# Patient Record
Sex: Male | Born: 1985 | Hispanic: No
Health system: Midwestern US, Community
[De-identification: ages and names within clinical notes are randomized; demographics above are authoritative.]

## PROBLEM LIST (undated history)

## (undated) HISTORY — PX: BRAIN SURGERY: SHX531

---

## 2015-12-08 NOTE — Nursing Note (Signed)
Ambulatory Vitals, Ht, Wt - Text       Ambulatory Vitals Height Weight Entered On:  12/08/2015 14:03 EST    Performed On:  12/08/2015 14:03 EST by Randa Evens.,  BOBBY K               Vitals/Ht/Wt   Weight Estimated :   81.65 kg(Converted to: 180 lb 0 oz, 180.007 lb)    Height/Length Estimated :   172.72 cm(Converted to: 5 ft 8 in, 5.67 ft, 68.00 in)    BSA Estimated :   1.98 m2   Body Mass Index Estimated :   27.37 kg/m2   Randa Evens.,  BOBBY K - 12/08/2015 14:03 EST

## 2015-12-14 NOTE — Nursing Note (Signed)
Nursing Discharge Summary - Text       Physician Discharge Summary Entered On:  12/14/2015 20:59 EST    Performed On:  12/14/2015 20:58 EST by Debroah Baller               DC Information   Provider Instructions for Diet :   Regular diet   Provider Instructions for Activity :   Other: See printed post operative instructions.   Provider Instructions for Wound Care :   Other: See printed post operative instructions.   Liliana Cline NICOLE - 12/14/2015 20:58 EST

## 2015-12-15 NOTE — Discharge Summary (Signed)
 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 office                         MEDICATION LIST  Medication Reconciliation at Discharge:         New Medications  Other Medications  HYDROcodone-acetaminophen (Norco 325 mg-5 mg oral tablet) 1-2 tabs Oral (given by mouth) every 4 hours as needed for moderate pain for 30 Days. 1-2 tablets by mouth every 4 hours as needed for pain   not to exceed 4000 mg acetaminophen per day. Refills: 0.  Last Dose:____________________  Medications that have not changed  Other Medications  Non-Formulary Medication (testosterone booster)   Last Dose:____________________         Patient's Final Home Medication List Upon Discharge:          HYDROcodone-acetaminophen (Norco 325 mg-5 mg oral tablet) 1-2 tabs Oral (given by mouth) every 4 hours as needed for moderate pain for 30 Days. 1-2 tablets by mouth every 4 hours as needed for pain   not to exceed 4000 mg acetaminophen per day. Refills: 0.  Non-Formulary Medication (testosterone booster)          Comment:       ORDERS         Order Name Order Details   Discharge Patient 12/15/15 13:57:00 EST, Discharge Home/Self Care

## 2015-12-15 NOTE — Discharge Summary (Signed)
 Inpatient Patient Summary               [facility] - [facility]  [address]  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 The details of those changes are reflected below:         New Medications  Other Medications  HYDROcodone-acetaminophen (Norco 325 mg-5 mg oral tablet) 1-2 tabs Oral (given by mouth) every 4 hours as needed for moderate pain for 30 Days. 1-2 tablets by mouth every 4 hours as needed for pain   not to exceed 4000 mg acetaminophen per day. Refills: 0.  Last Dose:____________________  Medications that have not changed  Other Medications  Non-Formulary Medication (testosterone booster)   Last Dose:____________________      [facility] would like to thank you for allowing us  to assist you with your healthcare needs. The following includes patient education materials and information regarding your injury/illness.  Tesar, Dev has been  given the following list of follow-up instructions, prescriptions, and patient education materials:  Follow-up Instructions             With: Address: When:   WATROBSKI-PA, ERIN NICOLE     [phone]    Comments:   12/21/15 2:45pm [place] office                It is important to always keep an active list of medications available so that you can share with other providers and manage your medications appropriately. As an additional courtesy, we are also providing you with your final active medications list that you can keep with you.          HYDROcodone-acetaminophen (Norco 325 mg-5 mg oral tablet) 1-2 tabs Oral (given by mouth) every 4 hours as needed for moderate pain for 30 Days. 1-2 tablets by mouth every 4 hours as needed for pain   not to exceed 4000 mg acetaminophen per day. Refills: 0.  Non-Formulary Medication (testosterone booster)       Take only the medications listed above. Contact your doctor prior to taking any medications not on this list.  Discharge instructions, if any, will display below  Instructions for Diet: INSTRUCTIONS FOR DIET%>Regular diet   Instructions for Supplements: SUPPLEMENT INSTRUCTIONS%>   Instructions for Activity: INSTRUCTIONS FOR ACTIVITY%>Other: See printed post operative instructions.   Instructions for Wound Care: INSTRUCTIONS FOR WOUND CARE%>Other: See printed post operative instructions.  Medication leaflets, if any, will display below     Patient education materials, if any,  will display below                   KNEE ARTHROSCOPY-POSTOPERATIVE INSTRUCTIONS  Pain Medication:   Prescription medication as directed   No additional Tylenol with prescription pain medication   Over the counter anti-inflammatory medication such as Advil or Aleve in addition, if needed and tolerated  Icing:   Ice pack/bag to knee every 30 minutes every few hours while awake   Elevate leg above the heart to decrease swelling and pain  Crutches:   Use crutches to weight bear on surgical  leg as tolerated (average 5-7 days)  Dressing:   Reinforce dressing with sterile gauze for any drainage within first 48 hours   Change dressing on post operative day #5   You may shower and get the wounds wet on post operative day #5   Do not submerge knee in water or scrub incision sites   Cover wounds with dry band aids following dressing change or shower   Continue compression stocking as needed for swelling  Physical Therapy/Activity:   Will be scheduled at your follow up visit if indicated   Begin active motion of ankle (ankle pumps) and knee immediately following surgery and 3-4 times per day  Follow Up Appointment:   Call office for follow up appointment to be scheduled 1-2 weeks from date of surgery, if not already scheduled  Call office for temperature greater than 102 degrees Fahrenheit and for excessive swelling, pain, or redness around incision sites                                                                   IS IT A STROKE? Act FAST and Check for these signs:   FACE Does the face look uneven?   ARM Does one arm drift down?   SPEECH Does their speech sound strange?   TIME Call 9-1-1 at any sign of stroke  Heart Attack Signs  Chest discomfort: Most heart attacks involve discomfort in the center of the chest and lasts more than a few minutes, or goes away and comes back. It can feel like uncomfortable pressure, squeezing, fullness or pain.  Discomfort in upper body: Symptoms can include pain or discomfort in one or both arms, back, neck, jaw or stomach.  Shortness of breath: With or without discomfort.  Other signs: Breaking out in a cold sweat, nausea, or lightheaded.  Remember, MINUTES DO MATTER. If you experience any of these heart attack warning signs, call 9-1-1 to get immediate medical attention!       Yes - Patient/Family/Caregiver demonstrates understanding of instructions given  ______________________________ ___________ ___________________ ___________  Patient/Family/ Caregiver  Signature Date/Time Provider Signature Date/Time

## 2015-12-15 NOTE — Op Note (Signed)
Operative Report    Dia Crawford, MD  Service Date: 12/15/2015    PREOPERATIVE DIAGNOSES:    1.  Bucket handle tear of the medial meniscus of the right knee.  2.  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    POSTOPERATIVE DIAGNOSES:  1.  Displaced bucket handle tear posterior horn through the mid third  of right medial meniscus.  ICD-10 is S83.241A.  2.  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  ICD-10  is M23.52.  3.  Loose body of the right knee.  ICD-10 is M23.41.    PROCEDURE PERFORMED:  1.  Arthroscopic partial right medial meniscectomy.  CPT code 82956.  2.  Removal of loose body, right knee.  CPT is [phone].  3.  Debridement of torn anterior cruciate ligament of the right knee.   CPT [phone].    SURGEON:  Dr. Si Raider.    ASSISTANT:  Lazarus Salines, certified PA.    ANESTHESIA:  General by Dr. Zara Council.    TOURNIQUET TIME:  Right lower extremity approximately 30 minutes.    INDICATIONS:  Mr. Flinchum is a 30 year old male who has an active  athletic background resulting in a torn anterior cruciate ligament of  his right knee in approximately 2007.  He underwent anterior cruciate  reconstruction elsewhere and had smooth recovery and returned to  unrestricted supports.  He then suffered a twisting injury to his  right knee in 2011, with a resulting tear of the graft of his right  knee.  He had no specific treatment from that point and has had  intermittent instability, but has been cautious with running and  cutting activities.  He has moved from job to job and recently has  been working with [employer], theatre/television/film.  He states that he squatted to move a cabinet into place  in mid-January 2014 and suddenly had the onset right knee pain and  could not extend his knee.  He was subsequently evaluated and was  noted to have a loss of motion of the knee with the locked joint about  20 degrees shy of full extension.   His exam was compatible with a  displaced bucket handle medial meniscus tear.  This was confirmed with  imaging studies and with his ongoing symptoms, he is seen now for  arthroscopy.    FINDINGS AT SURGERY:  At arthroscopy, the patient had an essentially  an absent torn anterior cruciate ligament.  There was a large loose  body, the central aspect of the notch that predictably was from the  avulsion from his previous reconstruction when the injury occurred in  2011.  This resulted in a loose body and the complete tear of the  ligament.  These required debridement at this procedure.  There was a  displaced bucket handle fragment involving the marginal two-thirds of  the medial meniscus that was displaced to the notch.  The tear was  through the devascularized white-white portion of the meniscus,  therefore, it was not deemed repairable.  He was not to have anterior  cruciate reconstruction at this point by his election, so meniscal  resection was elected.  The lateral meniscus was normal.  The  posterior cruciate was intact.  Articular surfaces were clear in all  compartments.    The assistance of the PA expedited the case for instrument management  and wound closure.    DESCRIPTION OF  PROCEDURE:  The patient was brought as an outpatient to  operating room number 1, placed in a supine position on the operating  table.  After an adequate level of general anesthesia was achieved,  the right knee was examined.  He was noted to have a 1+ Lachman with a  fairly firm endpoint with a positive pivot glide.  He had a trace of  adduction laxity at 30 degrees but was stable near full extension.  He  had full passive extension with flexion to 125 degrees under  anesthesia.  After exam, a pneumatic tourniquet was positioned about  the right proximal thigh.  He was placed in the [facility] leg holder.   The extremity was scrubbed and prepped with ChloraPrep, draped to  provide a sterile field in the usual fashion.  Two grams of  Ancef was  given IV preoperatively per protocol.  The extremity was exsanguinated  using an Esmarch tourniquet inflated to 300 mmHg.    Two portals were utilized for the case with an inferolateral portal  fashioned first with a stab wound adjacent to the patellar tendon.  A  blunt trocar was used to introduce the arthroscopic sheath.  A 4 mm 30  degree scope was passed into the joint.  The joint was distended with  sterile lactated Ringer's solution with gravity inflow.  Good  visualization was achieved.  Photographs were taken of all aspects of  the joint during the case.  The patellofemoral articulation was  visualized first, found to be clear with central tracking.  Medial and  lateral gutters were inspected and were normal.  The knee was placed  under abduction stress, the scope advanced medially where the meniscal  tear was encountered.  It was noted to be torn through the white-white  zone with a peripheral rim of tissue intact.  At this point, the tear  was reduced, but was macerated and unrepairable.  Scissor forceps were  introduced.  It was released at its anterior horn attachment and the  shaver was then used to shave the bulk of the meniscus at its  periphery to its posterior horn attachment.  It was then released with  scissor forceps and the fragment removed.  The 3.5 shaver was  introduced, and the meniscal surface was transitioned to a smooth  surface.  This completed, the partial meniscectomy with a smooth rim  of peripheral tissue remaining.  Attention was then turned to the  notch where the loose body was encountered.  It was resected piecemeal  taking small fragments with a grasping forceps.  The loose body itself  was approximately 1 cm x 8 mm in size.  It appeared to be attached to  the stump of the original anterior cruciate ligament which was  completely torn.  This was a graft that had been placed in 2007 and  was torn in 2011.  The 4.0 shaver was used to complete the debridement  of the  anterior cruciate.  The notch was significantly stenotic.  The  posterior cruciate was intact.  The lateral compartment was then  visualized with a normal lateral meniscus and no articular defects.   The joint was again inspected, no other pathology encountered.  At  this point, the joint was copiously irrigated, suctioned dry,  infiltrated with 20 mL of 0.5% plain Marcaine solution.  Sterile  compressive dressing was applied, tourniquet released.  The patient  awakened and taken to recovery area in stable condition.  He tolerated  the  procedure well, there were no complications.  Final instrument,  needle and sponge counts were correct.    DISPOSITION:  He is to be discharged to home, was to work on active  range of motion, could weight-bear as tolerated, use crutches as  needed, was to be seen in followup in approximately a week to initiate  therapy and received a prescription for Norco for pain.      Harlene Salts, MD  TR: *n DD: 12/15/2015 15:17 TD: 12/15/2015 17:03 Job#: [id number]  \\[id number]\\DOC#: [id number]  \\[id number]\\  Signature Line    Electronically Signed on 12/21/2015 09:14 AM EST  ________________________________________________  Tama Gander

## 2015-12-15 NOTE — Procedures (Signed)
IntraOp Record - Brent Rollins             IntraOp Record - [facility] Summary                                                                   Primary Physician:        Tama Gander    Case Number:              [id number]    Finalized Date/Time:      12/15/15 14:00:38    Pt. Name:                 Brent Rollins, Brent Rollins    D.O.B./Sex:               Mar 12, 1986    Male    Med Rec #:                [id number]    Physician:                Tama Gander    Financial #:              [id number]    Pt. Type:                 S    Room/Bed:                 /    Admit/Disch:              12/15/15 11:30:00 -    Institution:       [facility] - Case Times                                                                                                         Entry 1                                                                                                          Patient      In Room Time             12/15/15 13:15:00               Out Room Time                   12/15/15 13:54:00    Anesthesia     Procedure  Start Time               12/15/15 13:28:00               Stop Time                       12/15/15 13:51:00    Last Modified By:         Linward Foster RNPricilla Riffle                              12/15/15 14:00:21      Brent [facility] - Case Times Audit                                                                          12/15/15 14:00:21         Owner: Danella Sensing                               Modifier: KOSTPA                                                        <+> 1         Out Room Time        <+> 1         Stop Time        [facility] - Safety Checklist - Sign In                                                                                         Entry 1                                                                                                          History/Physical on       Yes                             Procedure Consent               Yes    Chart  on Chart     Site Marked (if            Yes    applicable)     Last Modified By:         Linward Foster, RN, Pricilla Riffle                              12/15/15 13:34:25      [facility]rent [facility] - Case Attendance                                                                                                    Entry 1                         Entry 2                         Entry 3                                          Case Attendee             TSALAPATAS-MD,  Brent Rollins          CALDWELL-MD,  Harriette Ohara, RN, Pricilla Riffle    Role Performed            Anesthesiologist                Surgeon Primary                 Circulator    Time In                   12/15/15 13:15:00               12/15/15 13:15:00               12/15/15 13:15:00    Time Out                  12/15/15 13:54:00               12/15/15 13:54:00               12/15/15 13:54:00    Procedure                 Knee Arthroscopy                Knee Arthroscopy                Knee Arthroscopy                              Operative(Right)                Operative(Right)  Operative(Right)    Last Modified By:         Linward Foster, RN, Lanell Persons, RN, Lanell Persons, RNPricilla Riffle                              12/15/15 14:00:34               12/15/15 14:00:34               12/15/15 14:00:34                                Entry 4                         Entry 5                                                                          Case Attendee             METTS,  Mora Bellman                                                              NICOLE    Role Performed            Surgical Scrub                  Other Authorized                                                              Personnel    Time In                   12/15/15 13:15:00               12/15/15 13:15:00    Time Out                  12/15/15 13:54:00               12/15/15 13:54:00    Procedure                 Knee Arthroscopy                Knee Arthroscopy                               Operative(Right)  Operative(Right)    Last Modified By:         Linward Foster, RN, Lanell Persons, RNPricilla Riffle                              12/15/15 14:00:34               12/15/15 14:00:34      [facility] - Case Attendance Audit                                                                     12/15/15 14:00:34         Owner: Danella Sensing                               Modifier: KOSTPA                                                            1     <+> Time Out            1     <*> Procedure                              Knee Arthroscopy Operative(Right)            2     <+> Time Out            2     <*> Procedure                              Knee Arthroscopy Operative(Right)            3     <+> Time Out            3     <*> Procedure                              Knee Arthroscopy Operative(Right)            4     <+> Time Out            4     <*> Procedure                              Knee Arthroscopy Operative(Right)            5     <+> Time In            5     <+> Time Out            5     <*> Procedure  Knee Arthroscopy Operative(Right)     12/15/15 13:33:21         Owner: ZOXWRU                               Modifier: KOSTPA                                                        <+> 5         Case Attendee        <+> 5         Role Performed        <+> 5         Procedure     12/15/15 13:31:44         Owner: [email or web address]                               Modifier: KOSTPA                                                        <+> 1         Time In        <+> 1         Procedure        <+> 2         Time In        <+> 2         Procedure            3     <+> Time In            3     <*> Procedure                              Knee Arthroscopy Operative(Right)            4     <+> Time In            4     <*> Procedure                              Knee Arthroscopy Operative(Right)     12/15/15 13:30:18         Owner: [email or web address]                               Modifier: KOSTPA                                                         <+> 3         Case Attendee        <+> 3         Role Performed        <+>  3         Procedure        <+> 4         Case Attendee        <+> 4         Role Performed        <+> 4         Procedure        [facility] - Skin Assessment                                                                                                    Entry 1                                                                                                          Skin Integrity            Intact    Last Modified By:         Linward Foster RNPricilla Riffle                              12/15/15 13:34:30      Brent Rollins - Patient Positioning                                                                                                Entry 1                                                                                                          Procedure                 Knee Arthroscopy                Body Position  Lithotomy                              Operative(Right)    Left Arm Position         Extended on Padded Arm          Right Arm Position              Extended on Padded Arm                              Board w/Security Strap                                          Board w/Security Strap    Left Leg Position         Stirrup Leg Support             Right Leg Position              Knee Distraction Device                              Secured In                                                      Used    Feet Uncrossed            Yes                             Pressure Points                 Yes                                                              Checked     Additional                [employer], Safety Strap    Information     Positioned By             Brent Rollins,  Brent Rollins,         Outcome Met (O.80)              Yes                              WATROBSKI-PA,  Marcy Salvo, RN,  Pricilla Riffle    Last Modified By:         Linward Foster, RN, Pricilla Riffle                              12/15/15 13:34:19      [facility] - Skin Prep                                                                                                          Entry 1                                                                                                          Hair Removal     Skin Prep      Prep Agents (Im.270)     Chlorhexidine Gluconate         Prep Area (Im.270)              Leg                              2% w/Alcohol     Prep Area Details        Right                           Prep By                         Tama Gander    Outcome Met (O.100)       Yes    Last Modified By:         Juanito Doom                              12/15/15 13:34:54      [facility] - Counts Initial and Final                                                                                           Entry 1  Initial Counts      Initial Counts           Linward Foster, RN, Pricilla Riffle,         Items included in               Sponges, Sharps     Performed By             Boyd Kerbs              the Initial Count     Final Counts      Final Counts             Linward Foster, RN, Pricilla Riffle,         Final Count Status              Correct     Performed By             Marcy Salvo J     Items Included in        Sponges, Sharps     Final Count     Outcome Met (O.20)        Yes    Last Modified By:         Linward Foster, RN, Pricilla Riffle                              12/15/15 13:32:22      Brent Rollins - General Case Data                                                                                                  Entry 1                                                                                                          Case Information      ASA Class                2                               Case Level                       Level 3     OR                       JI 01  Specialty                       Orthopedic (SN)     Wound Class              1-Clean    Preop Diagnosis           M23.203    Last Modified By:         Linward Foster RNPricilla Riffle                              12/15/15 13:31:39      [facility]rent Roll[facility] - Fire Risk Assessment                                                                                               Entry 1                                                                                                          Fire Risk                 Alcohol Based Prep              Fire Risk Score                 1    Assessment: If            Solution    checked, checkmark     = 1 point     Last Modified By:         Linward Foster RN, Pricilla Riffle                              12/15/15 13:32:38      Brent Rollins - Safety Checklist - Time Out                                                                                        Entry 1  Surgical/Procedure        Yes                             Time Out Complete               12/15/15 13:27:00    Team confirms     correct patient,     correct site and     correct procedure     Last Modified By:         Linward Foster RNPricilla Riffle                              12/15/15 13:29:48      [facility] - Patient Care Devices                                                                                               Entry 1                         Entry 2                                                                          Equipment Type            MACHINE SEQUENTIAL              BAIR                              COMPRESSION    SCD Sleeve Site           Leg Left    Equipment/Tag Number      [id number]                          [id number]    Initiated Pre             Yes    Induction     Last Modified By:         Linward Foster, RN, Lanell Persons, RNPricilla Riffle                              12/15/15  13:33:56               12/15/15 13:33:56      [facility] - Tourniquet  Entry 1                                                                                                          Tourniquet Type           TOURNIQUET PNEUMATIC            Serial Number                   [id number]    Setting                   300 mmHg                        Placement                       Leg Upper Right    Padding (Im.120)          Yes    Tourniquet Times      Inflated                 12/15/15 13:27:00               Deflated                        12/15/15 13:49:00    Applied By                Tama Gander         Outcome Met (O.60)              Yes    Last Modified By:         Juanito Doom                              12/15/15 13:55:32      [facility] - Tourniquet Audit                                                                          12/15/15 13:55:32         Owner: Danella Sensing                               Modifier: KOSTPA                                                            1     <*> Tourniquet  Type                        TOURNIQUET PNEUMATIC            1     <+> Deflated        [facility] - Medications                                                                                                        Entry 1                                                                                                          Medication                BUPIVACAINE HCL 0.5%            Route of Admin                  Local Injection                              INJECTION 0.5%    Dose/Volume               20 ML                           Site                            Knee    (include amount and     unit of measure)     Site Detail               Right                           Administered By                 Tama Gander    Outcome Met (O.130)       Yes    Last Modified By:         Linward Foster, RN, Pricilla Riffle                               12/15/15 13:50:04      [facility]rent [facility] - Medications Audit  12/15/15 13:50:04         Owner: ZOXWRU                               Modifier: KOSTPA                                                            1     <*> Medication                             BUPIVACAINE HCL 0.5% INJECTION 0.5%            1     <*> Dose/Volume (include amount and        30 ML                      unit of measure)        [facility] - Dressing/Packing                                                                                                   Entry 1                                                                                                          Site                      Knee                            Site Details                    Right    Dressing Item     Details      Dressing Item            Medicated Gauze, 4x4's,         Cast/Splint (Im.290)            Cast Padding     (Im.290)                 ABD, Elastic wrap    Last Modified By:         Linward Foster, RN, Pricilla Riffle  12/15/15 13:32:31      [facility] - Procedures                                                                                                         Entry 1                                                                                                          Procedure     Description      Procedure                Knee Arthroscopy                Modifiers                       Right                              Operative     Surgical Procedure       KNEE ARTHROSCOPY,     Text                     PARTIAL MEDIAL                              MENISCECTOMY, REMOVAL                              OF LO9OSE BODIES,                              DEBRIDEMENTY OF ACL                              RIGHT    Primary Procedure         Yes                             Primary Surgeon                 Tama Gander    Start                     12/15/15 13:28:00                Stop  12/15/15 13:51:00    Anesthesia Type           General                         Surgical Service                Orthopedic (SN)    Wound Class               1-Clean    Last Modified By:         Linward Foster RNPricilla Riffle                              12/15/15 14:00:28      [facility] - Procedures Audit                                                                          12/15/15 14:00:28         Owner: Danella Sensing                               Modifier: KOSTPA                                                        <+> 1         Stop     12/15/15 13:46:04         Owner: Danella Sensing                               Modifier: KOSTPA                                                            1     <*> Procedure                              Knee Arthroscopy Operative            1     <*> Surgical Procedure Text                KNEE ARTHROSCOPY, MENISCECTOMY RIGHT        [facility] - Safety Checklist - Sign Out  Entry 1                                                                                                          Patient Safety            Yes    Communication Guide     Used Throughout Case     Last Modified By:         Linward Foster RN, Pricilla Riffle                              12/15/15 13:34:33      [facility] - Transfer                                                                                                           Entry 1                                                                                                          Transferred By            Silvestre Moment,         Via                             Vinnie Langton, RN, Pricilla Riffle    Post-op Destination       PACU    Skin Assessment      Condition                Intact    Last Modified By:         Juanito Doom                              12/15/15 13:45:01

## 2021-03-21 ENCOUNTER — Emergency Department (HOSPITAL_COMMUNITY): Payer: Medicaid Other

## 2021-03-21 ENCOUNTER — Other Ambulatory Visit: Payer: Self-pay

## 2021-03-21 ENCOUNTER — Emergency Department (HOSPITAL_COMMUNITY)
Admission: EM | Admit: 2021-03-21 | Discharge: 2021-03-22 | Disposition: A | Discharge status: Home / Self Care | Payer: Medicaid Other | Attending: Emergency Medicine | Admitting: Emergency Medicine

## 2021-03-21 DIAGNOSIS — R202 Paresthesia of skin: Secondary | ICD-10-CM | POA: Insufficient documentation

## 2021-03-21 DIAGNOSIS — W208XXA Other cause of strike by thrown, projected or falling object, initial encounter: Secondary | ICD-10-CM | POA: Insufficient documentation

## 2021-03-21 DIAGNOSIS — Y990 Civilian activity done for income or pay: Secondary | ICD-10-CM | POA: Diagnosis not present

## 2021-03-21 DIAGNOSIS — M542 Cervicalgia: Secondary | ICD-10-CM | POA: Insufficient documentation

## 2021-03-21 DIAGNOSIS — R000 Tachycardia, unspecified: Secondary | ICD-10-CM | POA: Insufficient documentation

## 2021-03-21 DIAGNOSIS — S0990XA Unspecified injury of head, initial encounter: Secondary | ICD-10-CM

## 2021-03-21 DIAGNOSIS — S060X0A Concussion without loss of consciousness, initial encounter: Secondary | ICD-10-CM | POA: Insufficient documentation

## 2021-03-21 DIAGNOSIS — S0083XA Contusion of other part of head, initial encounter: Secondary | ICD-10-CM | POA: Diagnosis not present

## 2021-03-21 MED ORDER — ACETAMINOPHEN 500 MG PO TABS: 500.0000 mg | ORAL_TABLET | Freq: Four times a day (QID) | ORAL | 0 refills | Status: AC | PRN

## 2021-03-21 MED ORDER — HYDROCODONE-ACETAMINOPHEN 5-325 MG PO TABS
1.0000 | ORAL_TABLET | Freq: Once | ORAL | Status: AC
  Administered 2021-03-21: 1 via ORAL
  Filled 2021-03-21: qty 1

## 2021-03-21 MED ORDER — ONDANSETRON HCL 4 MG/2ML IJ SOLN: 4.0000 mg | Freq: Once | INTRAMUSCULAR | Status: DC

## 2021-03-21 MED ORDER — IBUPROFEN 800 MG PO TABS: 800.0000 mg | ORAL_TABLET | Freq: Three times a day (TID) | ORAL | 0 refills | Status: AC

## 2021-03-21 MED ORDER — ONDANSETRON 4 MG PO TBDP
4.0000 mg | ORAL_TABLET | Freq: Once | ORAL | Status: AC
  Administered 2021-03-21: 4 mg via ORAL
  Filled 2021-03-21: qty 1

## 2021-03-21 MED ORDER — TETANUS-DIPHTH-ACELL PERTUSSIS 5-2.5-18.5 LF-MCG/0.5 IM SUSY: 0.5000 mL | PREFILLED_SYRINGE | Freq: Once | INTRAMUSCULAR | Status: DC

## 2021-03-21 NOTE — Discharge Instructions (Signed)
Please schedule an appointment with a neurologist if you continue to have numbness and/or tingling or your arms or legs.

## 2021-03-21 NOTE — ED Notes (Signed)
Returned from MRI at this time, placed on monitor

## 2021-03-21 NOTE — ED Notes (Signed)
Patient refused IV at this time. Does not want blood work. ED MD made aware

## 2021-03-21 NOTE — ED Provider Notes (Signed)
Emergency Medicine Provider Triage Evaluation Note  Edward Bennett , a 35 y.o. male  was evaluated in triage.  Pt complains of head injury. States he was standing under a tree when a large tree limb fell onto his head from 40 feet. Denies loc. He did fall to the ground and is c/o neck pain, head pain, facial pain.  Review of Systems  Positive: Head pain, neck pain, facial pain Negative: loc  Physical Exam  BP (!) 142/100 (BP Location: Right Arm)   Pulse 86   Temp 98.6 F (37 C) (Oral)   Resp 18   SpO2 99%  Gen:   Awake, no distress   Resp:  Normal effort  MSK:   Moves extremities without difficulty  Other:  Mild ttp to the cervical spine, facial abrasion noted to the forehead  Medical Decision Making  Medically screening exam initiated at 3:55 PM.  Appropriate orders placed.  Najib Colmenares was informed that the remainder of the evaluation will be completed by another provider, this initial triage assessment does not replace that evaluation, and the importance of remaining in the ED until their evaluation is complete.     Karrie Meres, PA-C 03/21/21 1600    Tegeler, Canary Brim, MD 03/21/21 [phone]

## 2021-03-21 NOTE — ED Notes (Signed)
Pt ambulatory to [facility]

## 2021-03-21 NOTE — ED Provider Notes (Signed)
[facility]   CSN: [id number] Arrival date & time: 03/21/21  1545     History Chief Complaint  Patient presents with  . Head Injury    Edward Bennett is a 35 y.o. male.  The history is provided by the patient.  Head Injury Location:  Generalized Time since incident:  3 hours Mechanism of injury: direct blow   Mechanism of injury comment:  Tree limb fell onto his head from 40+ feet in the air Pain details:    Quality:  Aching   Radiates to:  Face   Severity:  Moderate   Duration:  3 hours   Timing:  Constant   Progression:  Unchanged Chronicity:  New Relieved by:  None tried Worsened by:  Movement Ineffective treatments:  None tried Associated symptoms: headache, nausea, neck pain and numbness   Associated symptoms: no blurred vision, no difficulty breathing, no disorientation, no double vision, no focal weakness, no hearing loss, no loss of consciousness, no memory loss, no seizures, no tinnitus and no vomiting   Neck Injury This is a new problem. The current episode started 3 to 5 hours ago. The problem occurs constantly. The problem has not changed since onset.Associated symptoms include headaches. Pertinent negatives include no chest pain, no abdominal pain and no shortness of breath. The symptoms are aggravated by twisting. Nothing relieves the symptoms. He has tried nothing for the symptoms.   Paresthesias to the R upper extremity with subjective numbness     History reviewed. No pertinent past medical history.  There are no problems to display for this patient.   History reviewed. No pertinent surgical history.     History reviewed. No pertinent family history.     Home Medications Prior to Admission medications   Medication Sig Start Date End Date Taking? Authorizing Provider  acetaminophen (TYLENOL) 500 MG tablet Take 1 tablet (500 mg total) by mouth every 6 (six) hours as needed for up to 5 days  for moderate pain. 03/21/21 03/26/21 Yes Ardeen FillersBuchanan, Myer Bohlman, DO  ibuprofen (ADVIL) 800 MG tablet Take 1 tablet (800 mg total) by mouth 3 (three) times daily for 3 days. 03/21/21 03/24/21 Yes Ardeen FillersBuchanan, Lenton Gendreau, DO    Allergies    Morphine and related and Tramadol  Review of Systems   Review of Systems  Constitutional: Negative for chills and fever.  HENT: Negative for ear pain, hearing loss, sore throat and tinnitus.   Eyes: Negative for blurred vision, double vision, pain and visual disturbance.  Respiratory: Negative for cough and shortness of breath.   Cardiovascular: Negative for chest pain and palpitations.  Gastrointestinal: Positive for nausea. Negative for abdominal pain and vomiting.  Genitourinary: Negative for dysuria and hematuria.  Musculoskeletal: Positive for neck pain. Negative for arthralgias and back pain.  Skin: Negative for color change and rash.  Neurological: Positive for numbness and headaches. Negative for focal weakness, seizures, loss of consciousness, syncope and weakness.  Psychiatric/Behavioral: Negative for memory loss.  All other systems reviewed and are negative.   Physical Exam Updated Vital Signs BP 138/85   Pulse 63   Temp 98.1 F (36.7 C) (Oral)   Resp 15   Ht 5\' 7"  (1.702 m)   Wt 81.6 kg   SpO2 96%   BMI 28.19 kg/m   Physical Exam Vitals and nursing note reviewed.  Constitutional:      Appearance: He is well-developed.  HENT:     Head: Normocephalic.     Jaw:  There is normal jaw occlusion.     Comments:  Abrasions to the forehead and upper scalp. Hemostatic. Mild hematoma without crepitus. Eyes:     General: No visual field deficit.    Conjunctiva/sclera: Conjunctivae normal.     Pupils: Pupils are equal.     Right eye: Pupil is round, reactive and not sluggish.     Left eye: Pupil is round, reactive and not sluggish.     Visual Fields: Right eye visual fields normal and left eye visual fields normal.  Neck:     Trachea: Trachea  normal.     Comments:  C-collar in place Cardiovascular:     Rate and Rhythm: Regular rhythm. Tachycardia present.     Pulses: Normal pulses.     Heart sounds: No murmur heard.   Pulmonary:     Effort: Pulmonary effort is normal. No respiratory distress.     Breath sounds: Normal breath sounds.  Abdominal:     Palpations: Abdomen is soft.     Tenderness: There is no abdominal tenderness.  Musculoskeletal:     Cervical back: Spinous process tenderness and muscular tenderness present.  Skin:    General: Skin is warm and dry.  Neurological:     General: No focal deficit present.     Mental Status: He is alert and oriented to person, place, and time.     GCS: GCS eye subscore is 4. GCS verbal subscore is 5. GCS motor subscore is 6.     Cranial Nerves: Cranial nerves are intact. No cranial nerve deficit, dysarthria or facial asymmetry.     Sensory: Sensory deficit present.     Motor: Motor function is intact.     Coordination: Coordination is intact.     Comments:  Subjective decreased sensation of LUE compared to right     ED Results / Procedures / Treatments   Labs (all labs ordered are listed, but only abnormal results are displayed) Labs Reviewed  COMPREHENSIVE METABOLIC PANEL  CBC WITH DIFFERENTIAL/PLATELET    EKG None  Radiology CT Head Wo Contrast  Result Date: 03/21/2021 CLINICAL DATA:  Recent trauma with headaches and neck pain, initial encounter EXAM: CT HEAD WITHOUT CONTRAST CT MAXILLOFACIAL WITHOUT CONTRAST CT CERVICAL SPINE WITHOUT CONTRAST TECHNIQUE: Multidetector CT imaging of the head, cervical spine, and maxillofacial structures were performed using the standard protocol without intravenous contrast. Multiplanar CT image reconstructions of the cervical spine and maxillofacial structures were also generated. COMPARISON:  None. FINDINGS: CT HEAD FINDINGS Brain: No evidence of acute infarction, hemorrhage, hydrocephalus, extra-axial collection or mass  lesion/mass effect. Vascular: No hyperdense vessel or unexpected calcification. Skull: Changes of prior right craniotomy are noted in the left posterior parietal region. Other: None. CT MAXILLOFACIAL FINDINGS Osseous: Irregularity of the nasal bones is seen without significant soft tissue abnormality. These likely represent chronic nasal bone fractures. No acute fracture is identified. Orbits: Orbits and their contents are within normal limits. Sinuses: Paranasal sinuses are unremarkable. Soft tissues: No significant soft tissue abnormality is noted. CT CERVICAL SPINE FINDINGS Alignment: Within normal limits. Skull base and vertebrae: 7 cervical segments are well visualized. Vertebral body height is well maintained. No acute fracture or acute facet abnormality is noted. Noted. Soft tissues and spinal canal: Surrounding soft tissue structures are within normal limits. Upper chest: Visualized lung apices are unremarkable. Other: None IMPRESSION: CT of the head: Postsurgical changes in the posterior left parietal region. No acute intracranial abnormality noted. CT of the maxillofacial bones: Mild irregularity of the  nasal bones is seen which appears chronic in nature without evidence of soft tissue abnormality. No other fracture is seen. CT of the cervical spine: No acute abnormality Electronically Signed   By: Alcide Clever M.D.   On: 03/21/2021 16:51   CT Cervical Spine Wo Contrast  Result Date: 03/21/2021 CLINICAL DATA:  Recent trauma with headaches and neck pain, initial encounter EXAM: CT HEAD WITHOUT CONTRAST CT MAXILLOFACIAL WITHOUT CONTRAST CT CERVICAL SPINE WITHOUT CONTRAST TECHNIQUE: Multidetector CT imaging of the head, cervical spine, and maxillofacial structures were performed using the standard protocol without intravenous contrast. Multiplanar CT image reconstructions of the cervical spine and maxillofacial structures were also generated. COMPARISON:  None. FINDINGS: CT HEAD FINDINGS Brain: No  evidence of acute infarction, hemorrhage, hydrocephalus, extra-axial collection or mass lesion/mass effect. Vascular: No hyperdense vessel or unexpected calcification. Skull: Changes of prior right craniotomy are noted in the left posterior parietal region. Other: None. CT MAXILLOFACIAL FINDINGS Osseous: Irregularity of the nasal bones is seen without significant soft tissue abnormality. These likely represent chronic nasal bone fractures. No acute fracture is identified. Orbits: Orbits and their contents are within normal limits. Sinuses: Paranasal sinuses are unremarkable. Soft tissues: No significant soft tissue abnormality is noted. CT CERVICAL SPINE FINDINGS Alignment: Within normal limits. Skull base and vertebrae: 7 cervical segments are well visualized. Vertebral body height is well maintained. No acute fracture or acute facet abnormality is noted. Noted. Soft tissues and spinal canal: Surrounding soft tissue structures are within normal limits. Upper chest: Visualized lung apices are unremarkable. Other: None IMPRESSION: CT of the head: Postsurgical changes in the posterior left parietal region. No acute intracranial abnormality noted. CT of the maxillofacial bones: Mild irregularity of the nasal bones is seen which appears chronic in nature without evidence of soft tissue abnormality. No other fracture is seen. CT of the cervical spine: No acute abnormality Electronically Signed   By: Alcide Clever M.D.   On: 03/21/2021 16:51   MR BRAIN WO CONTRAST  Result Date: 03/21/2021 CLINICAL DATA:  Initial evaluation for acute head trauma. EXAM: MRI HEAD WITHOUT CONTRAST TECHNIQUE: Multiplanar, multiecho pulse sequences of the brain and surrounding structures were obtained without intravenous contrast. COMPARISON:  Prior head CT from earlier the same day. FINDINGS: Brain: Cerebral volume within normal limits for age. No focal parenchymal signal abnormality. No abnormal foci of restricted diffusion to suggest  acute or subacute ischemia. Gray-white matter differentiation maintained. No encephalomalacia to suggest chronic cortical infarction. Susceptibility artifact at the left parietal convexity related to overlying craniotomy. No other evidence for acute or chronic intracranial hemorrhage. No mass lesion, midline shift or mass effect. No hydrocephalus or extra-axial fluid collection. Vascular: Major intracranial vascular flow voids are maintained. Skull and upper cervical spine: Craniocervical junction within normal limits. Bone marrow signal intensity normal. Prior left parietal craniotomy. Soft tissue contusion present at the central forehead. No visible calvarial fracture on prior CT. Sinuses/Orbits: Globes and orbital soft tissues within normal limits. Mild scattered mucosal thickening noted within the ethmoidal air cells and maxillary sinuses. No mastoid effusion. Inner ear structures grossly normal. Other: None. IMPRESSION: 1. No acute intracranial abnormality. 2. Soft tissue contusion at the central forehead. 3. Prior left parietal craniotomy. Electronically Signed   By: Rise Mu M.D.   On: 03/21/2021 22:55   MR Cervical Spine Wo Contrast  Result Date: 03/21/2021 CLINICAL DATA:  Initial evaluation for acute trauma, head injury, neck pain. EXAM: MRI CERVICAL SPINE WITHOUT CONTRAST TECHNIQUE: Multiplanar, multisequence MR imaging of the  cervical spine was performed. No intravenous contrast was administered. COMPARISON:  Prior CT from earlier the same day. FINDINGS: Alignment: Straightening of the normal cervical lordosis. No listhesis or static subluxation. Vertebrae: Vertebral body height maintained without acute or chronic fracture. Bone marrow signal intensity within normal limits. No discrete or worrisome osseous lesions. No abnormal marrow edema. Cord: Normal signal morphology. No evidence for traumatic cord injury. No findings to suggest ligamentous injury. Posterior Fossa, vertebral arteries,  paraspinal tissues: Craniocervical junction within normal limits. Paraspinous and prevertebral soft tissues normal. Normal flow voids seen within the vertebral arteries bilaterally. Disc levels: C2-C3: Unremarkable. C3-C4: Tiny central disc protrusion minimally indents the ventral thecal sac. No spinal stenosis. Foramina remain patent. C4-C5: Tiny central disc protrusion minimally indents the ventral thecal sac. No spinal stenosis. Foramina remain patent. C5-C6: Mild disc bulge with uncovertebral hypertrophy. No spinal stenosis. Foramina remain patent. C6-C7:  Unremarkable. C7-T1:  Unremarkable. Visualized upper thoracic spine demonstrates no significant finding. IMPRESSION: 1. No acute traumatic injury within the cervical spine. 2. Mild noncompressive disc bulging at C3-4 through C5-6 without stenosis. Electronically Signed   By: Rise Mu M.D.   On: 03/21/2021 23:42   CT Maxillofacial Wo Contrast  Result Date: 03/21/2021 CLINICAL DATA:  Recent trauma with headaches and neck pain, initial encounter EXAM: CT HEAD WITHOUT CONTRAST CT MAXILLOFACIAL WITHOUT CONTRAST CT CERVICAL SPINE WITHOUT CONTRAST TECHNIQUE: Multidetector CT imaging of the head, cervical spine, and maxillofacial structures were performed using the standard protocol without intravenous contrast. Multiplanar CT image reconstructions of the cervical spine and maxillofacial structures were also generated. COMPARISON:  None. FINDINGS: CT HEAD FINDINGS Brain: No evidence of acute infarction, hemorrhage, hydrocephalus, extra-axial collection or mass lesion/mass effect. Vascular: No hyperdense vessel or unexpected calcification. Skull: Changes of prior right craniotomy are noted in the left posterior parietal region. Other: None. CT MAXILLOFACIAL FINDINGS Osseous: Irregularity of the nasal bones is seen without significant soft tissue abnormality. These likely represent chronic nasal bone fractures. No acute fracture is identified. Orbits:  Orbits and their contents are within normal limits. Sinuses: Paranasal sinuses are unremarkable. Soft tissues: No significant soft tissue abnormality is noted. CT CERVICAL SPINE FINDINGS Alignment: Within normal limits. Skull base and vertebrae: 7 cervical segments are well visualized. Vertebral body height is well maintained. No acute fracture or acute facet abnormality is noted. Noted. Soft tissues and spinal canal: Surrounding soft tissue structures are within normal limits. Upper chest: Visualized lung apices are unremarkable. Other: None IMPRESSION: CT of the head: Postsurgical changes in the posterior left parietal region. No acute intracranial abnormality noted. CT of the maxillofacial bones: Mild irregularity of the nasal bones is seen which appears chronic in nature without evidence of soft tissue abnormality. No other fracture is seen. CT of the cervical spine: No acute abnormality Electronically Signed   By: Alcide Clever M.D.   On: 03/21/2021 16:51    Procedures Procedures   Medications Ordered in ED Medications  ondansetron (ZOFRAN-ODT) disintegrating tablet 4 mg (4 mg Oral Given 03/21/21 1723)  HYDROcodone-acetaminophen (NORCO/VICODIN) 5-325 MG per tablet 1 tablet (1 tablet Oral Given 03/21/21 1809)  HYDROcodone-acetaminophen (NORCO/VICODIN) 5-325 MG per tablet 1 tablet (1 tablet Oral Given 03/21/21 2241)  ondansetron (ZOFRAN-ODT) disintegrating tablet 4 mg (4 mg Oral Given 03/21/21 2241)    ED Course  I have reviewed the triage vital signs and the nursing notes.  Pertinent labs & imaging results that were available during my care of the patient were reviewed by me and considered in  my medical decision making (see chart for details).    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 Significant mechanism of injury. No  LOC. He was HDS with subjective numbness and sensory changes to the RUE compared to left. Ordered for CTH, cervical spine and face. Though considered, I have a low index of suspicion for traumata thoracic, lumbar or intrathoracic/intraabominal trauma that would require additional imaging at this time.  ED course: CT imaging without evidence of ICH or cervical spine fracture/malalignment. Still having sensory changes on repeat neurologic exam of now the R face, RUE and less so in the RLE. Ordered for MR brain, cervical spine.  Reassessment: Pain controlled. Does not want blood work drawn. Exam and subjective symptoms are improving, slightly, with time. MRI brain without any acute traumatic abnormalities. MR c-spine also reassuring without evidence of fracture, malalignment or cord injury.  On reassessment, his neuro exam is already improving. He reports some intermittent paraesthesias however numbness is gone.  I gave him contact information for neurology if his symptoms persist or do not continue to improve. He may need nerve conduction studies or physical therapy.  RX motrin and tylenol OTC as needed for pain.  Discharged in good condition.   Final Clinical Impression(s) / ED Diagnoses Final diagnoses:  Concussion without loss of consciousness, initial encounter  Paresthesias  Injury of head, initial encounter    Rx / DC Orders ED Discharge Orders         Ordered    ibuprofen (ADVIL) 800 MG tablet  3 times daily        03/21/21 2357    acetaminophen (TYLENOL) 500 MG tablet  Every 6 hours PRN        03/21/21 2357           Ardeen Fillers, DO 03/22/21 0004    Tegeler, Canary Brim, MD 03/24/21 1124

## 2021-03-21 NOTE — ED Triage Notes (Signed)
Patient to ED for complaints of head injury. States while at work tree branch fell and hit him on the head. Denies LOC. States tree hit the front of his face and he fell. Does state that he had some nausea and blurred vision. History of TBI and had "plate in head".

## 2021-03-21 NOTE — ED Notes (Signed)
Pat to MRI 

## 2021-03-22 ENCOUNTER — Encounter (HOSPITAL_COMMUNITY): Payer: Self-pay | Admitting: Student in an Organized Health Care Education/Training Program

## 2021-03-22 NOTE — ED Notes (Signed)
Patient given discharge paperwork and prescriptions. Verbalized understanding of teaching. No IV access. Ambulatory to exit in NAD with steady gait. 

## 2021-03-24 NOTE — Progress Notes (Deleted)
NEUROLOGY CONSULTATION NOTE  ONDRA DEBOARD MRN: [id number] DOB: [birth year]  Referring provider: Doreatha Martin, MD (ED referral) Primary care provider: ***  Reason for consult:  Headache and numbness  Assessment/Plan:   ***   Subjective:  Edward Bennett is a 35 year old ***-handed male with history of psychosis and drug abuse (heroin, amphetamines) and history of *** who presents for headache and numbness.  History supplemented by ED notes.  CT head and MRI of brain/cervical spine from 03/21/2021 personally reviewed.  On 03/21/2021, a tree limb fell striking his head from over 40 feet in the air.  No loss of consciousness but he developed a headache and neck pain.  Headache described as moderate aching pain ***.  Worse by movement. Associated with nausea, and right upper extremity numbness but no visual disturbance, focal weakness, confusion or speech disturbance.  He was seen in the ED at [facility].  CT head and maxillofacial revealed prior left parietal craniotomy but no acute intracranial abnormalities.  Follow up MRI of brain also unremarkable except form prior left parietal craniotomy.  MRI of cervical spine showed mild disc bulging but no evidence of fracture, cord abnormality or canal/foraminal stenosis.  He was in a *** in which he sustained SDH requiring craniotomy.  He reportedly had a seizure afterwards and was started on Keppra.  ***  PAST MEDICAL HISTORY: No past medical history on file.  PAST SURGICAL HISTORY: Past Surgical History:  Procedure Laterality Date  . BRAIN SURGERY      MEDICATIONS: Current Outpatient Medications on File Prior to Visit  Medication Sig Dispense Refill  . acetaminophen (TYLENOL) 500 MG tablet Take 1 tablet (500 mg total) by mouth every 6 (six) hours as needed for up to 5 days for moderate pain. 20 tablet 0  . ibuprofen (ADVIL) 800 MG tablet Take 1 tablet (800 mg total) by mouth 3 (three) times daily for 3 days. 9 tablet  0   No current facility-administered medications on file prior to visit.    ALLERGIES: Allergies  Allergen Reactions  . Morphine And Related     Other reaction(s): Other (See Comments) Headache Headache   . Tramadol     FAMILY HISTORY: No family history on file.  Objective:  *** General: No acute distress.  Patient appears well-groomed.   Head:  Normocephalic/atraumatic Eyes:  fundi examined but not visualized Neck: supple, no paraspinal tenderness, full range of motion Back: No paraspinal tenderness Heart: regular rate and rhythm Lungs: Clear to auscultation bilaterally. Vascular: No carotid bruits. Neurological Exam: Mental status: alert and oriented to person, place, and time, recent and remote memory intact, fund of knowledge intact, attention and concentration intact, speech fluent and not dysarthric, language intact. Cranial nerves: CN I: not tested CN II: pupils equal, round and reactive to light, visual fields intact CN III, IV, VI:  full range of motion, no nystagmus, no ptosis CN V: facial sensation intact. CN VII: upper and lower face symmetric CN VIII: hearing intact CN IX, X: gag intact, uvula midline CN XI: sternocleidomastoid and trapezius muscles intact CN XII: tongue midline Bulk & Tone: normal, no fasciculations. Motor:  muscle strength 5/5 throughout Sensation:  Pinprick, temperature and vibratory sensation intact. Deep Tendon Reflexes:  2+ throughout,  toes downgoing.   Finger to nose testing:  Without dysmetria.   Heel to shin:  Without dysmetria.   Gait:  Normal station and stride.  Romberg negative.    Thank you for allowing me to  take part in the care of this patient.  Shon Millet, DO

## 2021-03-25 ENCOUNTER — Ambulatory Visit: Payer: Self-pay | Admitting: Neurology

## 2021-03-25 ENCOUNTER — Other Ambulatory Visit: Payer: Self-pay

## 2021-03-26 ENCOUNTER — Other Ambulatory Visit: Payer: Self-pay

## 2021-03-26 ENCOUNTER — Emergency Department (HOSPITAL_COMMUNITY)
Admission: EM | Admit: 2021-03-26 | Discharge: 2021-03-26 | Disposition: A | Discharge status: Home / Self Care | Payer: Medicaid Other | Attending: Emergency Medicine | Admitting: Emergency Medicine

## 2021-03-26 DIAGNOSIS — F17210 Nicotine dependence, cigarettes, uncomplicated: Secondary | ICD-10-CM | POA: Diagnosis not present

## 2021-03-26 DIAGNOSIS — M5431 Sciatica, right side: Secondary | ICD-10-CM | POA: Insufficient documentation

## 2021-03-26 DIAGNOSIS — S3992XA Unspecified injury of lower back, initial encounter: Secondary | ICD-10-CM | POA: Diagnosis present

## 2021-03-26 DIAGNOSIS — Y990 Civilian activity done for income or pay: Secondary | ICD-10-CM | POA: Diagnosis not present

## 2021-03-26 DIAGNOSIS — S39012A Strain of muscle, fascia and tendon of lower back, initial encounter: Secondary | ICD-10-CM | POA: Diagnosis not present

## 2021-03-26 DIAGNOSIS — W208XXA Other cause of strike by thrown, projected or falling object, initial encounter: Secondary | ICD-10-CM | POA: Insufficient documentation

## 2021-03-26 MED ORDER — METHYLPREDNISOLONE 4 MG PO TBPK: ORAL_TABLET | ORAL | 0 refills | Status: AC

## 2021-03-26 MED ORDER — LIDOCAINE 5 % EX PTCH: 1.0000 | MEDICATED_PATCH | CUTANEOUS | 0 refills | Status: AC

## 2021-03-26 MED ORDER — METHYLPREDNISOLONE 4 MG PO TBPK: ORAL_TABLET | ORAL | 0 refills | Status: DC

## 2021-03-26 MED ORDER — METHOCARBAMOL 500 MG PO TABS
500.0000 mg | ORAL_TABLET | Freq: Once | ORAL | Status: AC
  Administered 2021-03-26: 500 mg via ORAL
  Filled 2021-03-26: qty 1

## 2021-03-26 MED ORDER — LIDOCAINE 5 % EX PTCH: 1.0000 | MEDICATED_PATCH | CUTANEOUS | 0 refills | Status: DC

## 2021-03-26 MED ORDER — ACETAMINOPHEN 325 MG PO TABS
650.0000 mg | ORAL_TABLET | Freq: Once | ORAL | Status: AC
  Administered 2021-03-26: 650 mg via ORAL
  Filled 2021-03-26: qty 2

## 2021-03-26 NOTE — Discharge Instructions (Addendum)
Take medications as prescribed. Continue taking Tylenol to help with your symptoms along with this medication. Follow-up with your primary care provider. Return to the ER if you start to experience chest pain, shortness of breath, additional injuries or losing control of your bowels or bladder

## 2021-03-26 NOTE — ED Provider Notes (Signed)
[facility] Provider Note   CSN: [id number] Arrival date & time: 03/26/21  1924     History Chief Complaint  Patient presents with  . Back Pain    Edward Bennett is a 35 y.o. male complaining of continued back pain radiating down his right leg for the past 5 days.  He was seen and evaluated with symptoms began.  States that a log fell on his head at work.  States that his lawyer told him that if he continues to have pain that he should come back to the ER for evaluation.  Denies any subsequent injury or trauma.  He is concerned that this could be due to his sciatic nerve.  He has only been taking Aleve but has not been taking anything else for pain.  Denies any loss of bowel or bladder function, saddle anesthesia, shortness of breath or chest pain.  HPI     No past medical history on file.  There are no problems to display for this patient.   Past Surgical History:  Procedure Laterality Date  . BRAIN SURGERY         No family history on file.  Social History   Tobacco Use  . Smoking status: Current Some Day Smoker    Types: Cigarettes  . Smokeless tobacco: Former Engineer, water Use Topics  . Alcohol use: Yes  . Drug use: Not Currently    Home Medications Prior to Admission medications   Medication Sig Start Date End Date Taking? Authorizing Provider  acetaminophen (TYLENOL) 500 MG tablet Take 1 tablet (500 mg total) by mouth every 6 (six) hours as needed for up to 5 days for moderate pain. 03/21/21 03/26/21  Ardeen Fillers, DO  lidocaine (LIDODERM) 5 % Place 1 patch onto the skin daily. Remove & Discard patch within 12 hours or as directed by MD 03/26/21   Dietrich Pates, PA-C  methylPREDNISolone (MEDROL DOSEPAK) 4 MG TBPK tablet Taper over 6 days. 03/26/21   Joseandres Mazer, PA-C    Allergies    Morphine and related and Tramadol  Review of Systems   Review of Systems  Genitourinary: Negative for dysuria.  Musculoskeletal:  Positive for back pain and myalgias.  Neurological: Negative for weakness, numbness and headaches.    Physical Exam Updated Vital Signs BP (!) 147/79   Pulse 71   Temp 98.4 F (36.9 C) (Oral)   Resp 18   Ht 5\' 7"  (1.702 m)   Wt 84.8 kg   SpO2 100%   BMI 29.29 kg/m   Physical Exam Vitals and nursing note reviewed.  Constitutional:      General: He is not in acute distress.    Appearance: He is well-developed. He is not diaphoretic.  HENT:     Head: Normocephalic and atraumatic.  Eyes:     General: No scleral icterus.    Conjunctiva/sclera: Conjunctivae normal.  Pulmonary:     Effort: Pulmonary effort is normal. No respiratory distress.  Musculoskeletal:        General: Tenderness present.     Cervical back: Normal range of motion.     Thoracic back: Tenderness present.     Lumbar back: Tenderness present.       Back:     Comments: No midline spinal tenderness present in lumbar, thoracic or cervical spine. No step-off palpated. No visible bruising, edema or temperature change noted. No objective signs of numbness present. No saddle anesthesia. 2+ DP pulses bilaterally. Sensation intact to  light touch. Strength 5/5 in bilateral lower extremities.  Skin:    Findings: No rash.  Neurological:     Mental Status: He is alert.     ED Results / Procedures / Treatments   Labs (all labs ordered are listed, but only abnormal results are displayed) Labs Reviewed - No data to display  EKG None  Radiology No results found.  Procedures Procedures   Medications Ordered in ED Medications  methocarbamol (ROBAXIN) tablet 500 mg (500 mg Oral Given 03/26/21 2019)  acetaminophen (TYLENOL) tablet 650 mg (650 mg Oral Given 03/26/21 2019)    ED Course  I have reviewed the triage vital signs and the nursing notes.  Pertinent labs & imaging results that were available during my care of the patient were reviewed by me and considered in my medical decision making (see chart for  details).    MDM Rules/Calculators/A&P                          35 year old male presenting to the ED with a chief complaint of back pain.  This has been persistent for 5 days.  I had a long fall on his head at work earlier this week.  Chart review shows extensive imaging done when he presented initially after the incident.  He had MRI of the cervical spine and brain done as well as CT scans that did not find any traumatic injury.  He reports sharp shooting pain down his leg and is concerned that it could be due to his sciatic nerve.  I suspect that his symptoms are musculoskeletal at this point due to his reassuring work-up and physical exam findings.  I will treat with steroids to help with sciatica as well as continuing Tylenol and lidocaine patches.  Informed him that his symptoms will take time to improve.  Doubt that his symptoms are due to cauda equina, dissection, other emergent cause.  He did not fall onto his back when the incident happened.  He remains ambulatory today and has been since incident.  Return precautions given.   Patient is hemodynamically stable, in NAD, and able to ambulate in the ED. Evaluation does not show pathology that would require ongoing emergent intervention or inpatient treatment. I explained the diagnosis to the patient. Pain has been managed and has no complaints prior to discharge. Patient is comfortable with above plan and is stable for discharge at this time. All questions were answered prior to disposition. Strict return precautions for returning to the ED were discussed. Encouraged follow up with PCP.   An After Visit Summary was printed and given to the patient.   Portions of this note were generated with Scientist, clinical (histocompatibility and immunogenetics). Dictation errors may occur despite best attempts at proofreading.  Final Clinical Impression(s) / ED Diagnoses Final diagnoses:  Strain of lumbar region, initial encounter  Sciatica of right side    Rx / DC Orders ED  Discharge Orders         Ordered    methylPREDNISolone (MEDROL DOSEPAK) 4 MG TBPK tablet  Status:  Discontinued        03/26/21 1941    lidocaine (LIDODERM) 5 %  Every 24 hours,   Status:  Discontinued        03/26/21 1942    lidocaine (LIDODERM) 5 %  Every 24 hours        03/26/21 2019    methylPREDNISolone (MEDROL DOSEPAK) 4 MG TBPK tablet  03/26/21 2019           Dietrich Pates, PA-C 03/26/21 2020    Lorre Nick, MD 03/27/21 1630

## 2021-03-26 NOTE — ED Triage Notes (Signed)
Pt came in with c/o back pain and leg numbness. Mostly R side leg numbness. Back pain is in thoracic spine. Pt states that a tree fell on his head at work. He was seen at [facility] previous.

## 2022-10-16 IMAGING — CT CT CERVICAL SPINE W/O CM
3 of 4 series · 10 of 33 positions shown, 12 images · non-contrast
Comparison: None.

CLINICAL DATA: Recent trauma with headaches and neck pain, initial
encounter

EXAM:
CT HEAD WITHOUT CONTRAST
CT MAXILLOFACIAL WITHOUT CONTRAST
CT CERVICAL SPINE WITHOUT CONTRAST
TECHNIQUE: Multidetector CT imaging of the head, cervical spine, and
maxillofacial structures were performed using the standard protocol
without intravenous contrast. Multiplanar CT image reconstructions
of the cervical spine and maxillofacial structures were also
generated.

[Series 4: c_spine 2.0 st · axial · 0.35mm/px · z∈[-295,-231]mm · 2 of 98 slices shown, 3 images]
[im 33/98  soft-tissue]
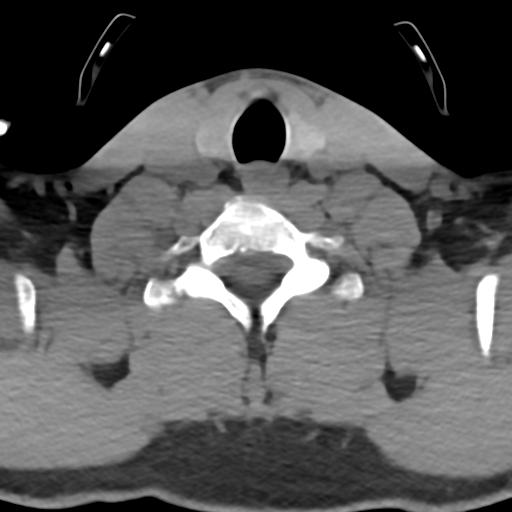
[im 33/98  bone]
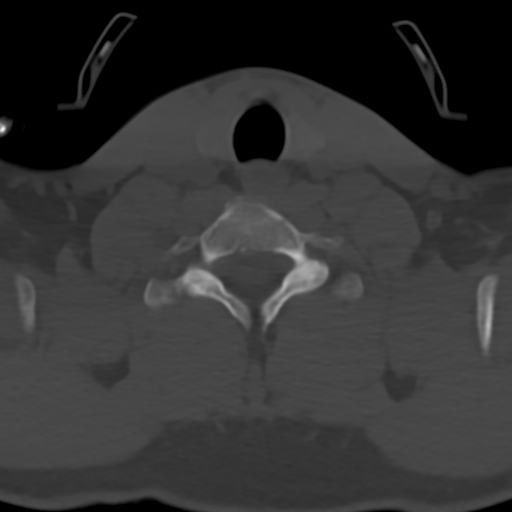
[im 65/98  bone]
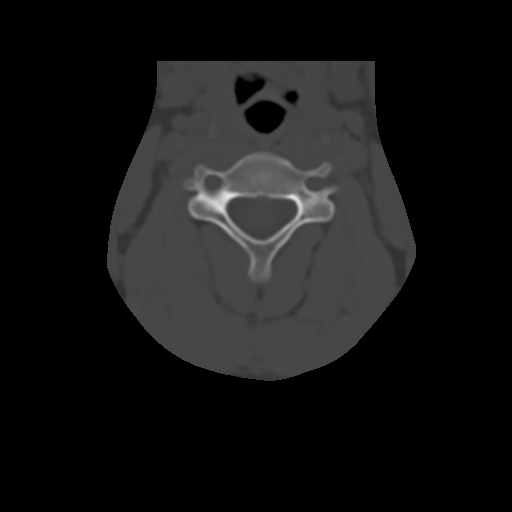

[Series 10: c_spine 2.0 sag bone · sagittal · 0.29mm/px · 5 of 61 slices shown, 6 images]
[im 21/61  bone]
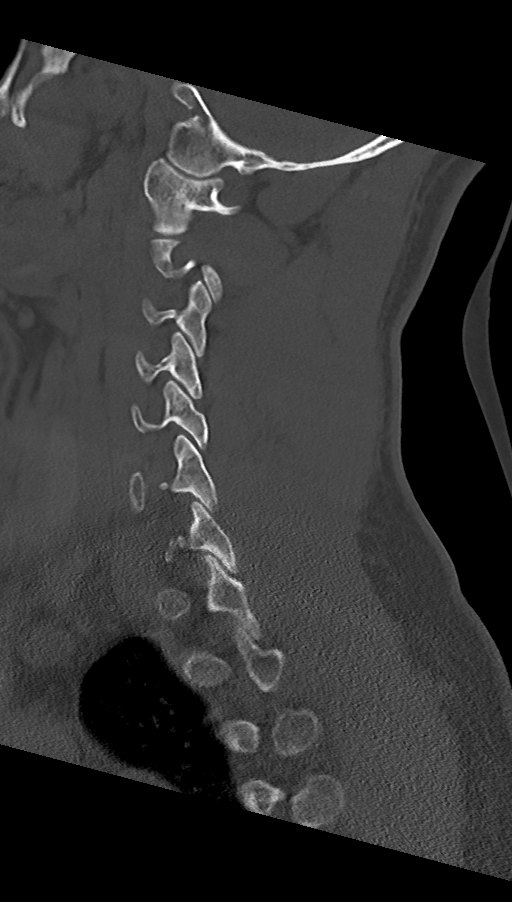
[im 26/61  bone]
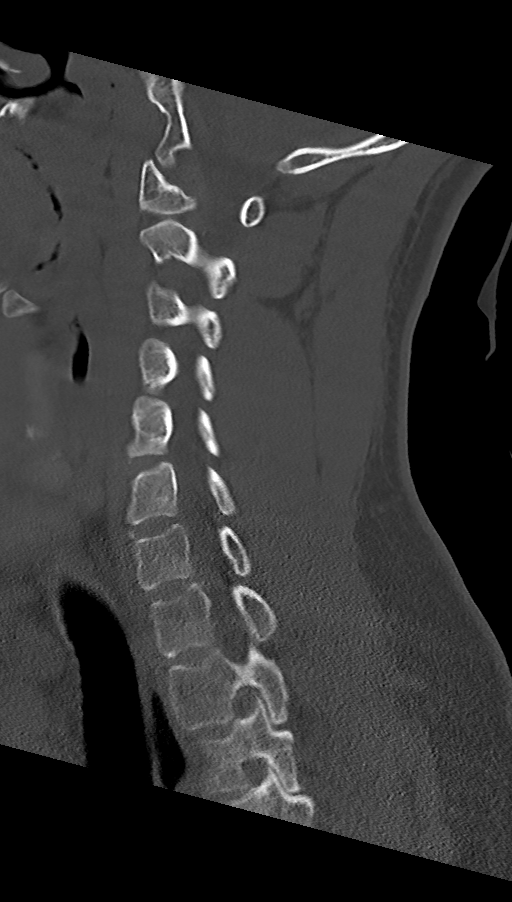
[im 31/61  soft-tissue]
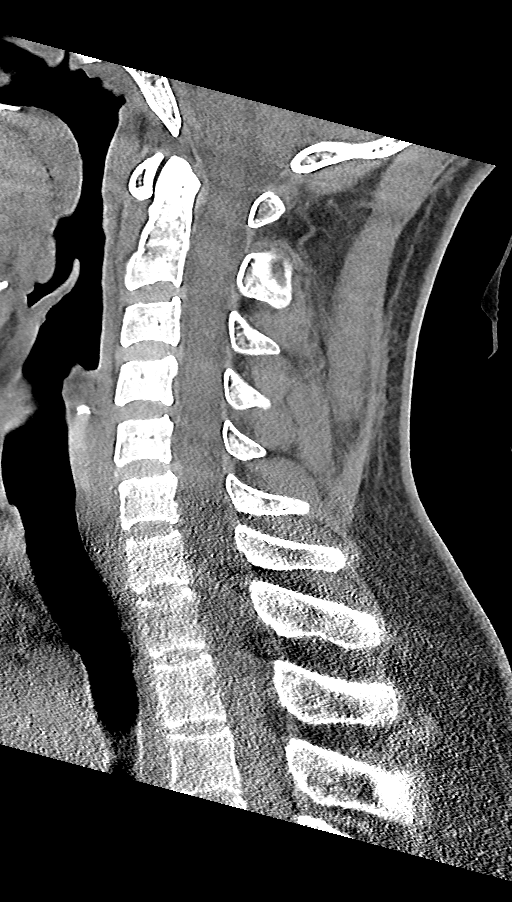
[im 31/61  bone]
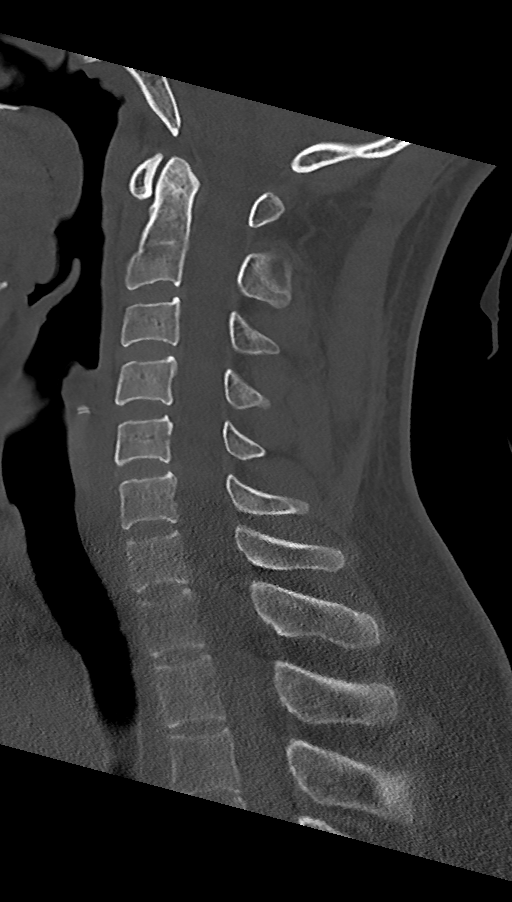
[im 36/61  bone]
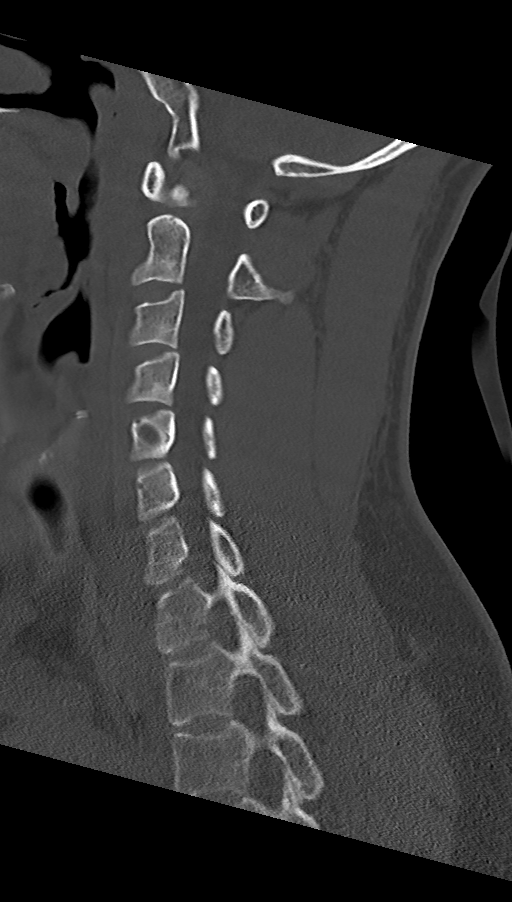
[im 41/61  bone]
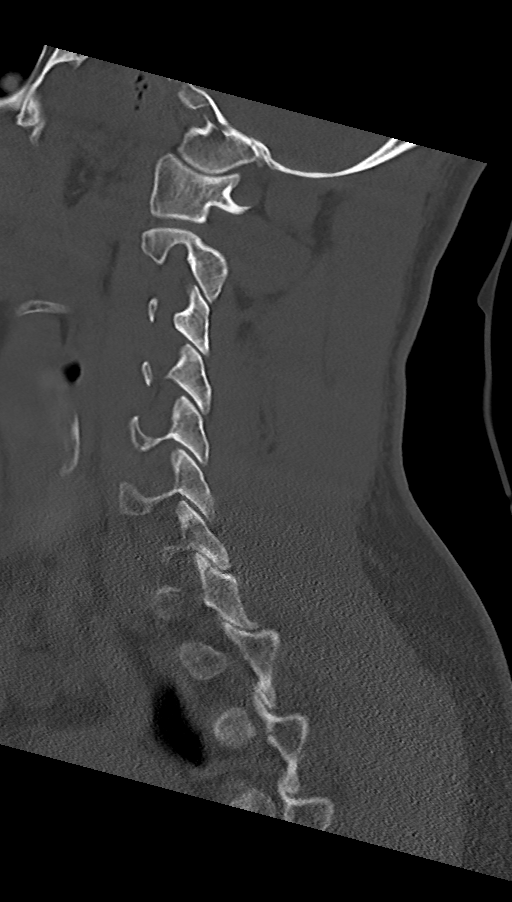

[Series 12: c_spine 2.0 cor bone · coronal · 0.29mm/px · 3 of 61 slices shown]
[im 13/61  bone]
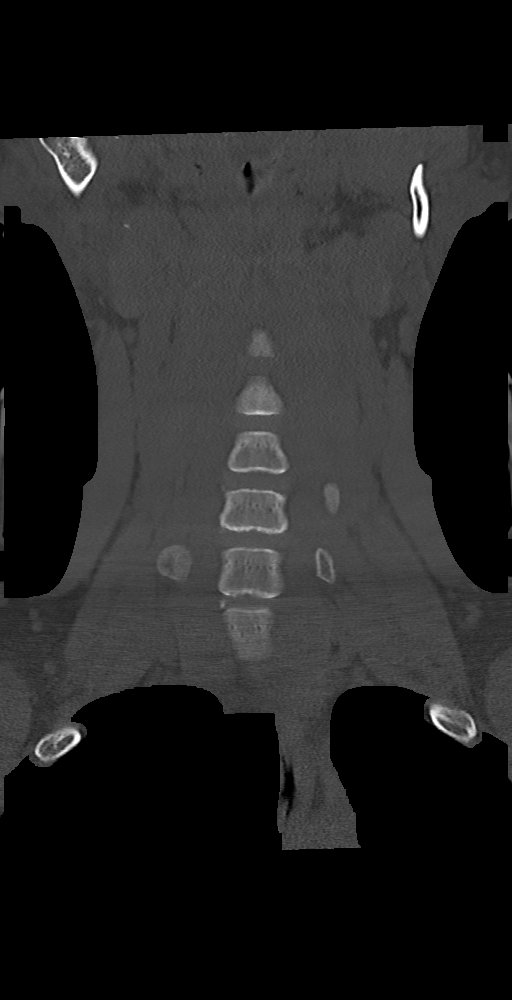
[im 25/61  bone]
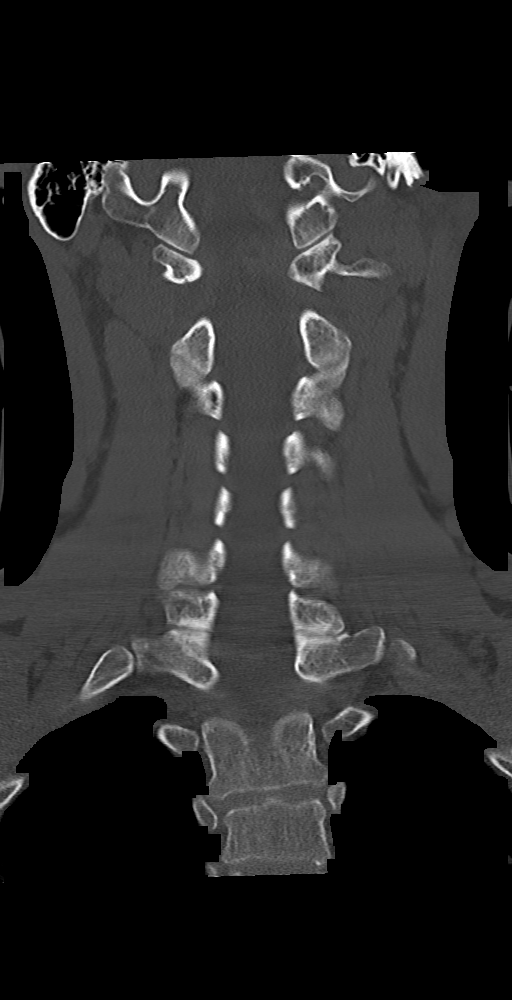
[im 37/61  bone]
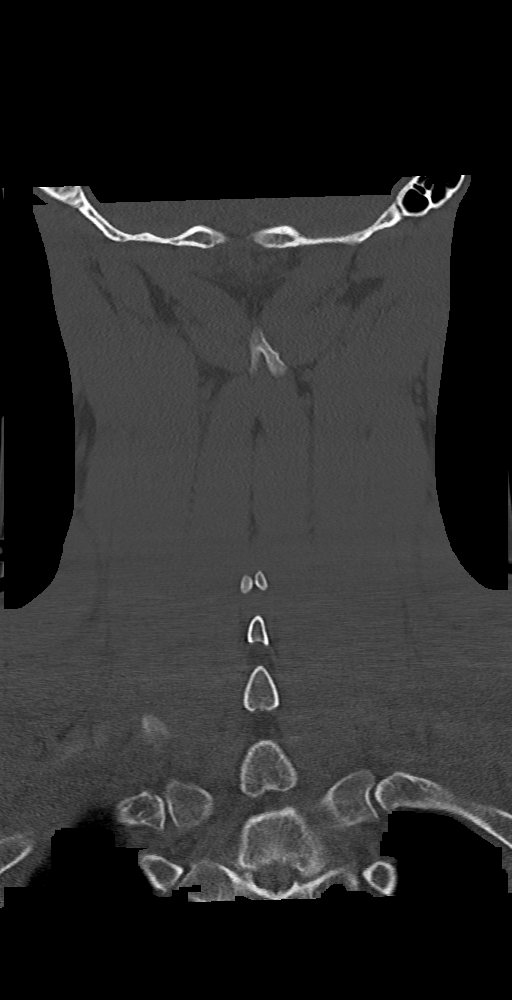

[10 of 33 positions shown; findings below may reference images not displayed]

FINDINGS: CT HEAD FINDINGS

Brain: No evidence of acute infarction, hemorrhage, hydrocephalus,
extra-axial collection or mass lesion/mass effect.

Vascular: No hyperdense vessel or unexpected calcification.

Skull: Changes of prior right craniotomy are noted in the left
posterior parietal region.

Other: None.

CT MAXILLOFACIAL FINDINGS

Osseous: Irregularity of the nasal bones is seen without significant
soft tissue abnormality. These likely represent chronic nasal bone
fractures. No acute fracture is identified.

Orbits: Orbits and their contents are within normal limits.

Sinuses: Paranasal sinuses are unremarkable.

Soft tissues: No significant soft tissue abnormality is noted.

CT CERVICAL SPINE FINDINGS

Alignment: Within normal limits.

Skull base and vertebrae: 7 cervical segments are well visualized.
Vertebral body height is well maintained. No acute fracture or acute
facet abnormality is noted.

Noted.

Soft tissues and spinal canal: Surrounding soft tissue structures
are within normal limits.

Upper chest: Visualized lung apices are unremarkable.

Other: None
IMPRESSION: CT of the head: Postsurgical changes in the posterior left parietal
region.

No acute intracranial abnormality noted.

CT of the maxillofacial bones: Mild irregularity of the nasal bones
is seen which appears chronic in nature without evidence of soft
tissue abnormality. No other fracture is seen.

CT of the cervical spine: No acute abnormality

## 2022-10-16 IMAGING — CT CT MAXILLOFACIAL W/O CM
3 series · 14 of 47 positions shown, 16 images · non-contrast
Comparison: None.

CLINICAL DATA: Recent trauma with headaches and neck pain, initial
encounter

EXAM:
CT HEAD WITHOUT CONTRAST
CT MAXILLOFACIAL WITHOUT CONTRAST
CT CERVICAL SPINE WITHOUT CONTRAST
TECHNIQUE: Multidetector CT imaging of the head, cervical spine, and
maxillofacial structures were performed using the standard protocol
without intravenous contrast. Multiplanar CT image reconstructions
of the cervical spine and maxillofacial structures were also
generated.

[Series 3: facialbone 2.0 st · axial · 0.34mm/px · z∈[-244,-98]mm · 8 of 85 slices shown, 10 images]
[im 6/85  brain]
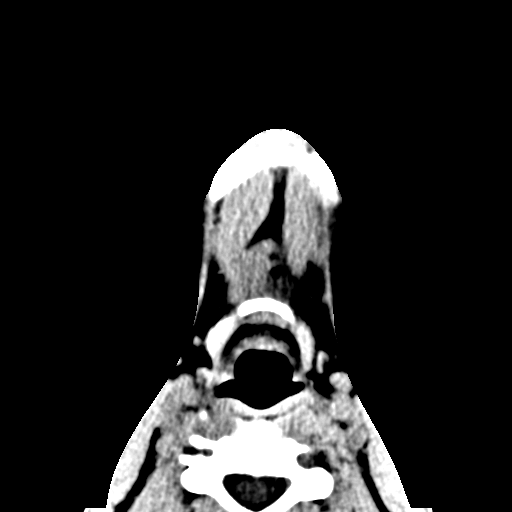
[im 6/85  bone]
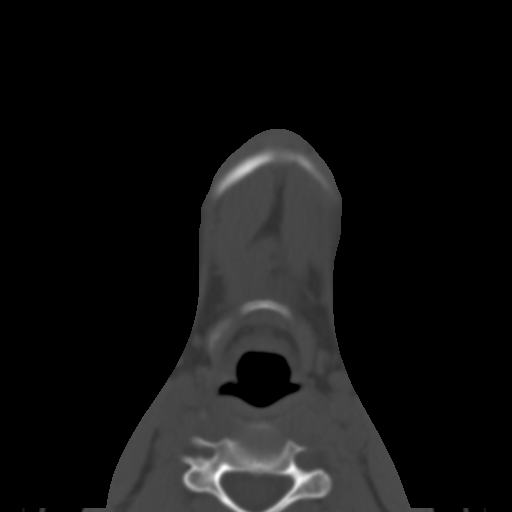
[im 18/85  bone]
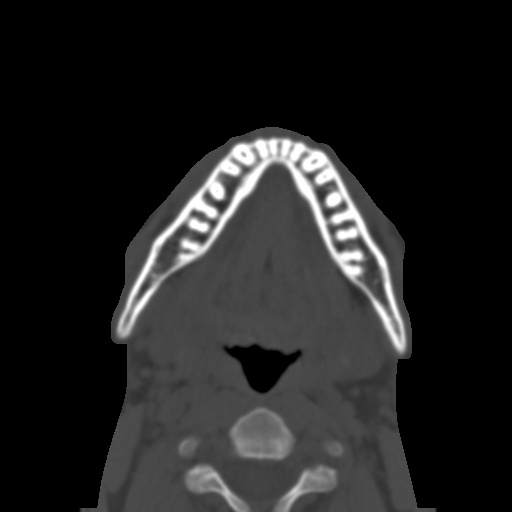
[im 27/85  bone]
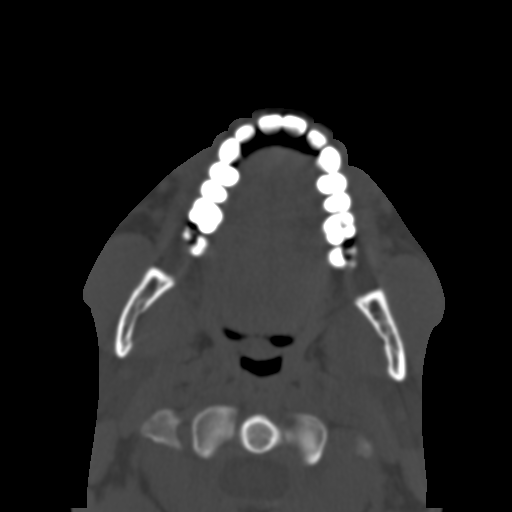
[im 38/85  bone]
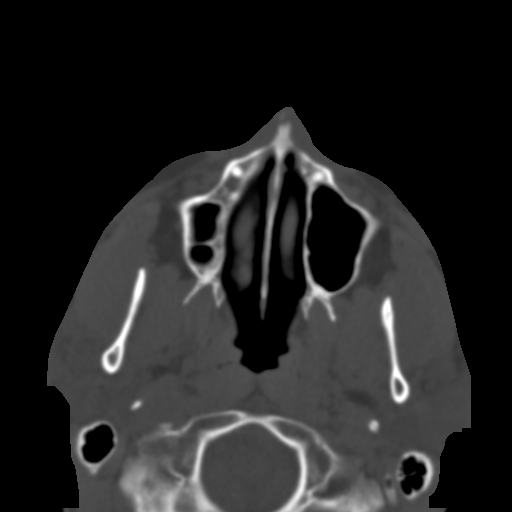
[im 47/85  brain]
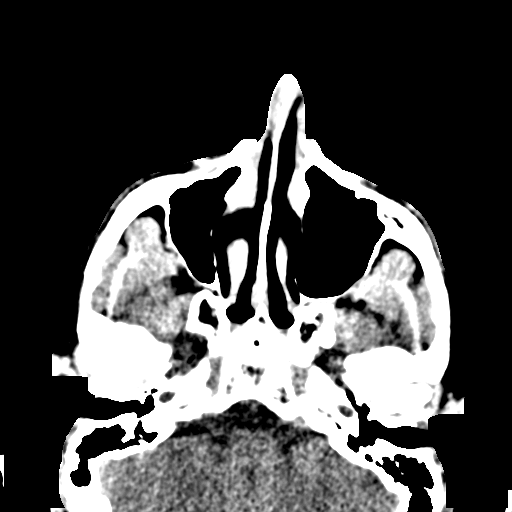
[im 47/85  bone]
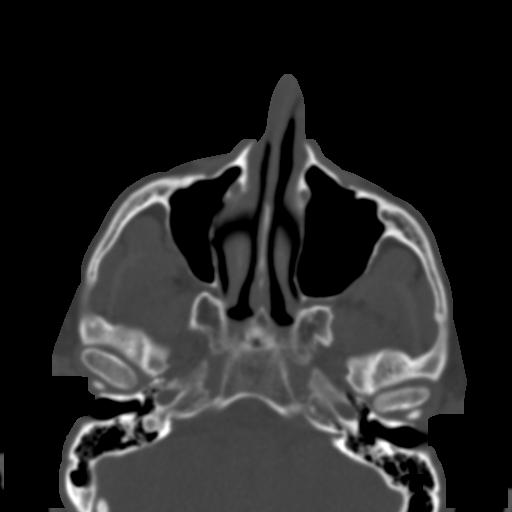
[im 58/85  bone]
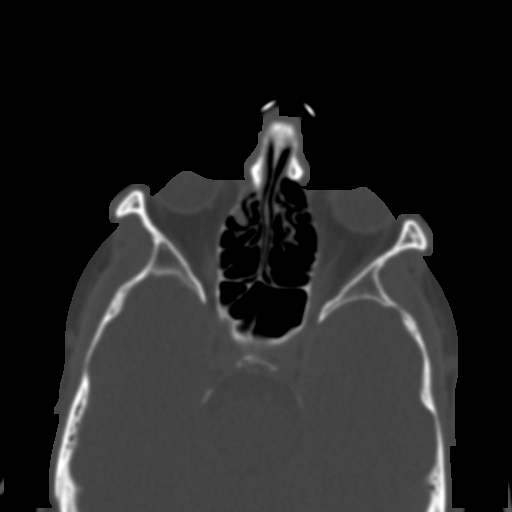
[im 67/85  bone]
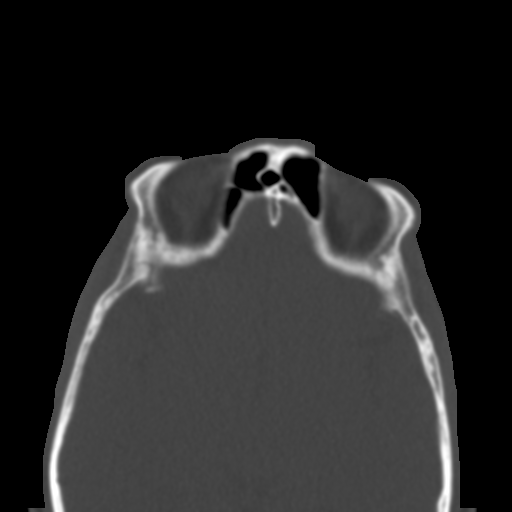
[im 79/85  bone]
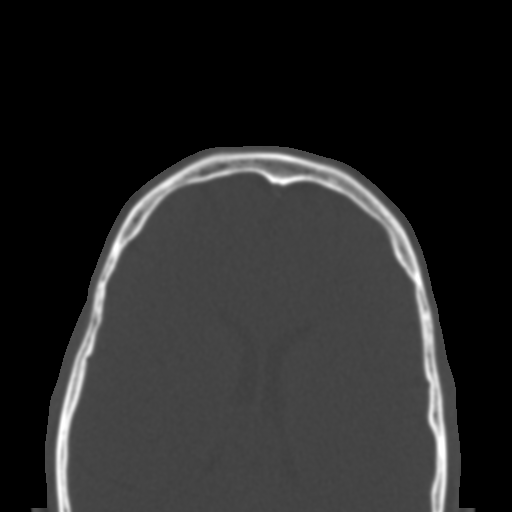

[Series 9: facialbone 2.0 cor st · coronal · 0.30mm/px · 3 of 97 slices shown]
[im 33/97  bone]
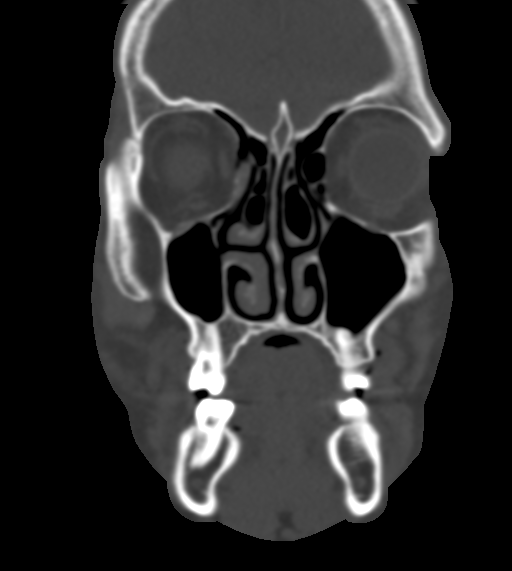
[im 43/97  bone]
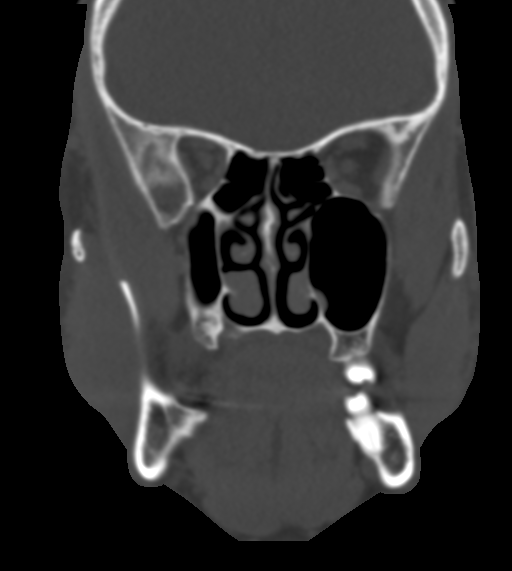
[im 54/97  bone]
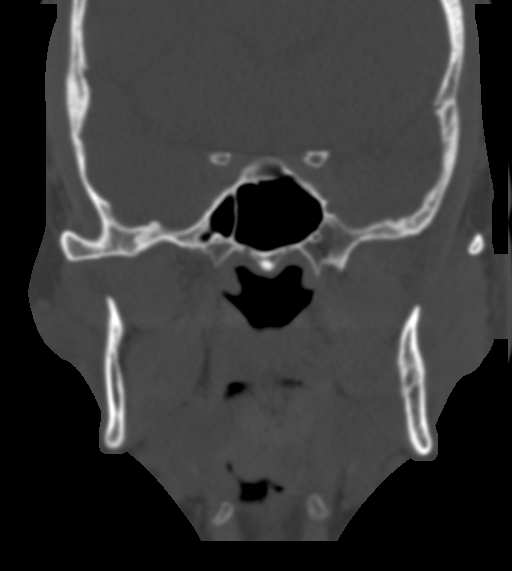

[Series 11: facialbone 2.0 sag st · sagittal · 0.36mm/px · 3 of 85 slices shown]
[im 29/85  bone]
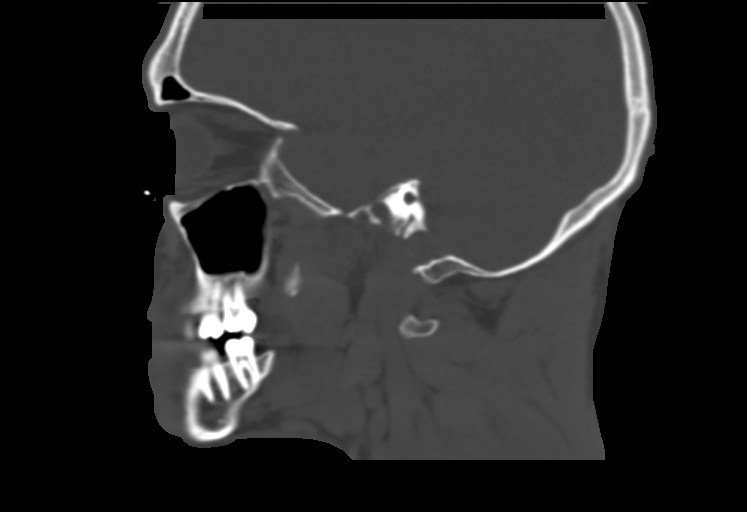
[im 43/85  bone]
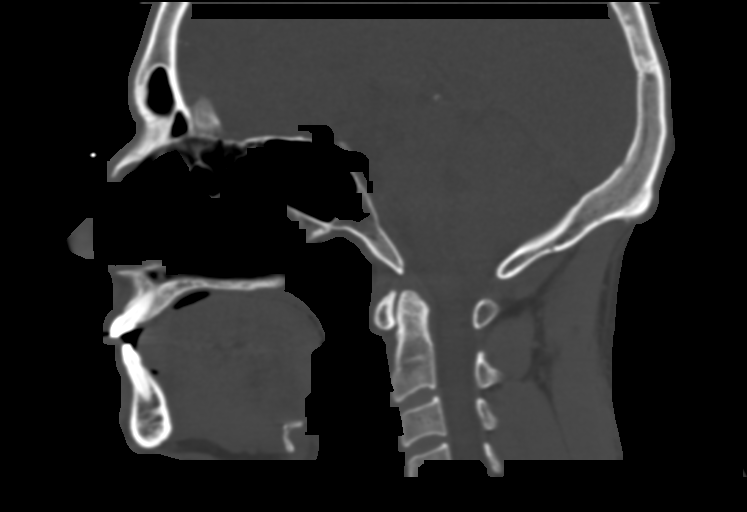
[im 57/85  bone]
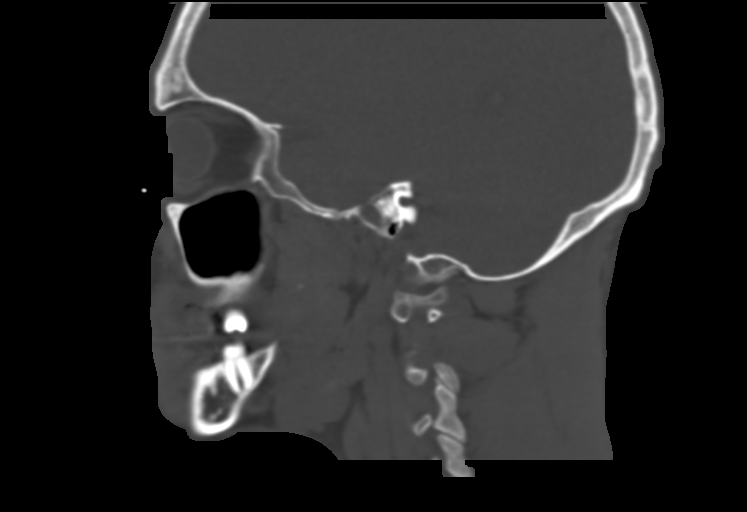

[14 of 47 positions shown; findings below may reference images not displayed]

FINDINGS: CT HEAD FINDINGS

Brain: No evidence of acute infarction, hemorrhage, hydrocephalus,
extra-axial collection or mass lesion/mass effect.

Vascular: No hyperdense vessel or unexpected calcification.

Skull: Changes of prior right craniotomy are noted in the left
posterior parietal region.

Other: None.

CT MAXILLOFACIAL FINDINGS

Osseous: Irregularity of the nasal bones is seen without significant
soft tissue abnormality. These likely represent chronic nasal bone
fractures. No acute fracture is identified.

Orbits: Orbits and their contents are within normal limits.

Sinuses: Paranasal sinuses are unremarkable.

Soft tissues: No significant soft tissue abnormality is noted.

CT CERVICAL SPINE FINDINGS

Alignment: Within normal limits.

Skull base and vertebrae: 7 cervical segments are well visualized.
Vertebral body height is well maintained. No acute fracture or acute
facet abnormality is noted.

Noted.

Soft tissues and spinal canal: Surrounding soft tissue structures
are within normal limits.

Upper chest: Visualized lung apices are unremarkable.

Other: None
IMPRESSION: CT of the head: Postsurgical changes in the posterior left parietal
region.

No acute intracranial abnormality noted.

CT of the maxillofacial bones: Mild irregularity of the nasal bones
is seen which appears chronic in nature without evidence of soft
tissue abnormality. No other fracture is seen.

CT of the cervical spine: No acute abnormality

## 2022-10-16 IMAGING — CT CT HEAD W/O CM
4 series · 16 of 47 positions shown, 18 images · non-contrast
Comparison: None.

CLINICAL DATA: Recent trauma with headaches and neck pain, initial
encounter

EXAM:
CT HEAD WITHOUT CONTRAST
CT MAXILLOFACIAL WITHOUT CONTRAST
CT CERVICAL SPINE WITHOUT CONTRAST
TECHNIQUE: Multidetector CT imaging of the head, cervical spine, and
maxillofacial structures were performed using the standard protocol
without intravenous contrast. Multiplanar CT image reconstructions
of the cervical spine and maxillofacial structures were also
generated.

[Series 3: head without · axial · non-contrast · 0.45mm/px · z∈[-148,-28]mm · 7 of 34 slices shown, 9 images]
[im 5/34  brain]
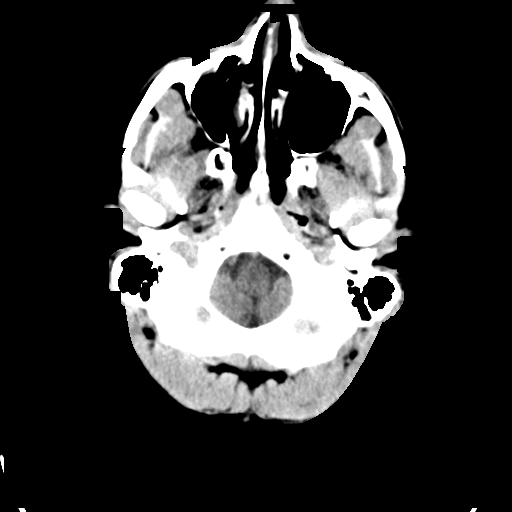
[im 5/34  bone]
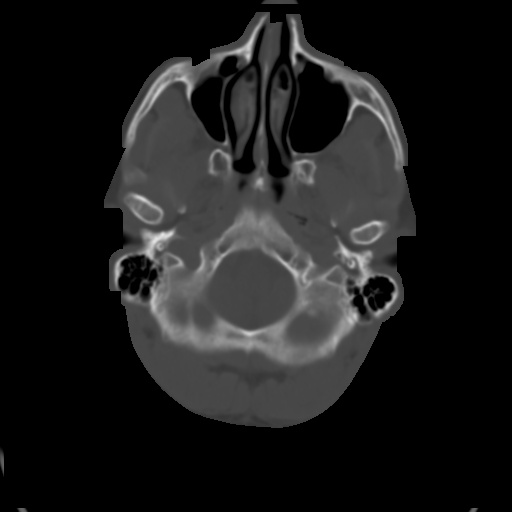
[im 9/34  brain]
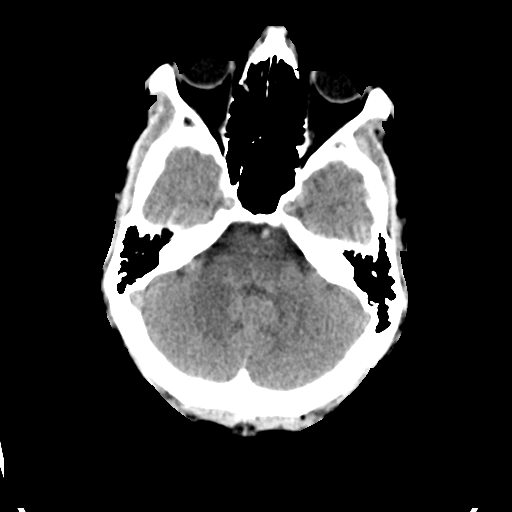
[im 13/34  brain]
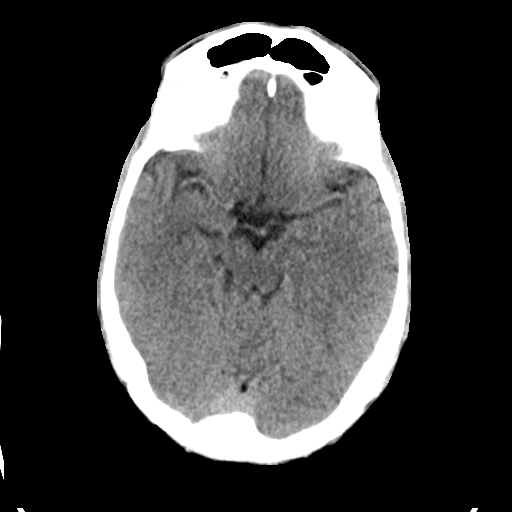
[im 17/34  brain]
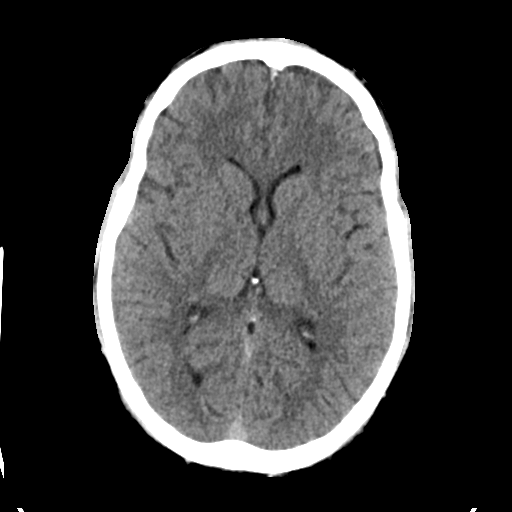
[im 21/34  brain]
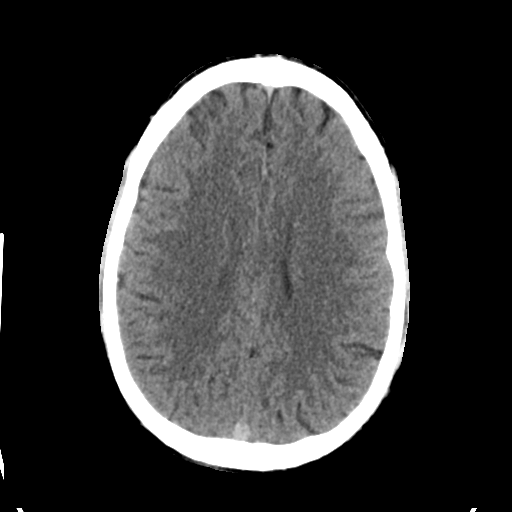
[im 21/34  bone]
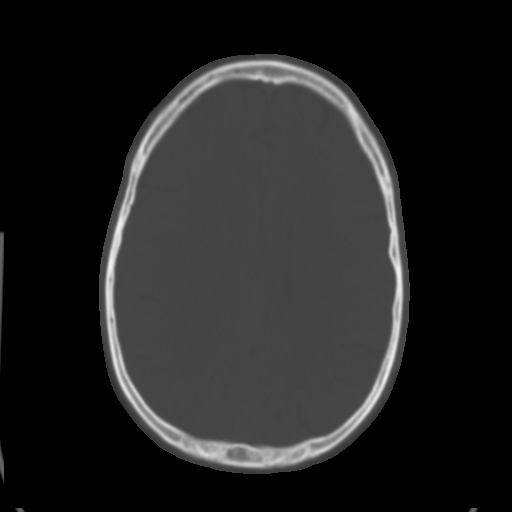
[im 25/34  brain]
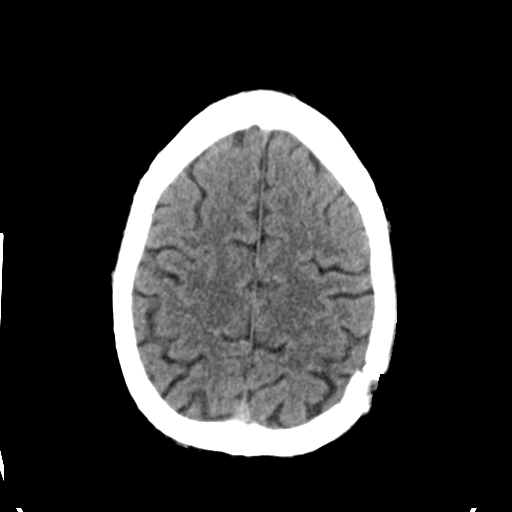
[im 29/34  brain]
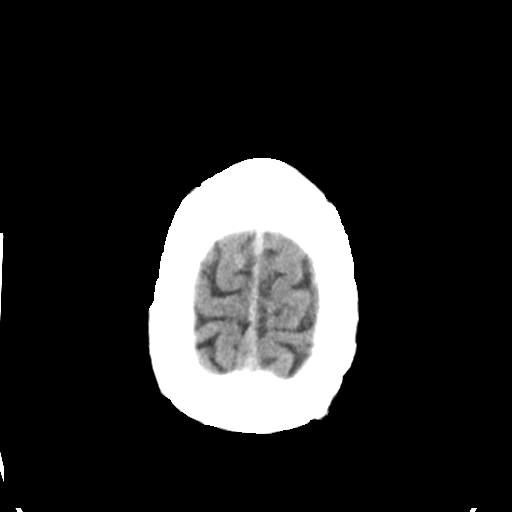

[Series 4: head bone · axial · 0.45mm/px · z∈[-152,-120]mm · 3 of 84 slices shown]
[im 9/84  bone]
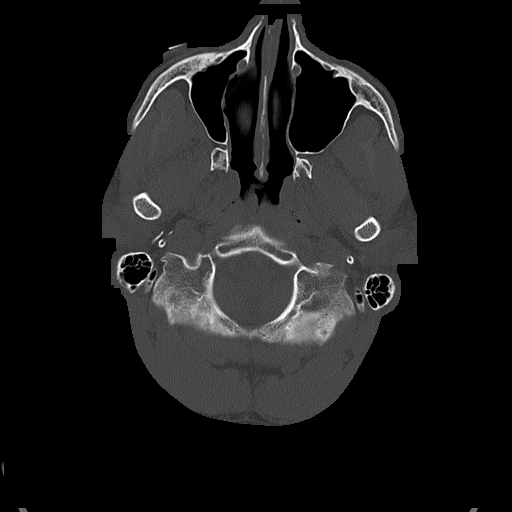
[im 17/84  bone]
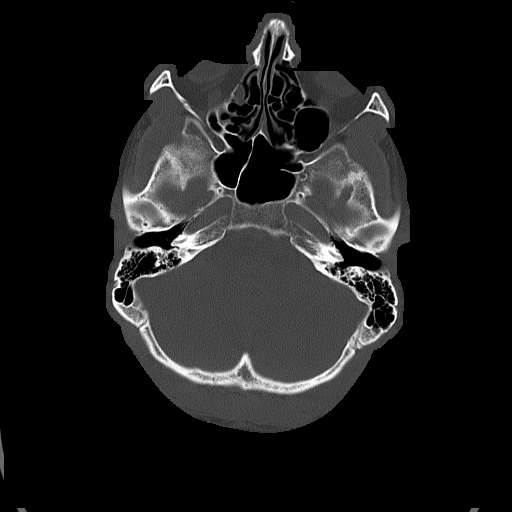
[im 25/84  bone]
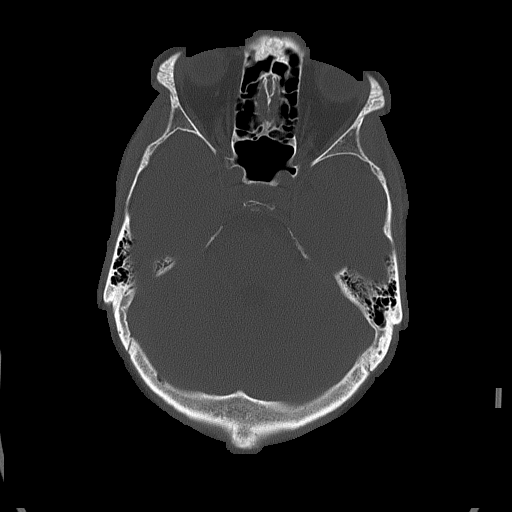

[Series 5: head without cor · coronal · non-contrast · 0.32mm/px · 3 of 76 slices shown]
[im 26/76  brain]
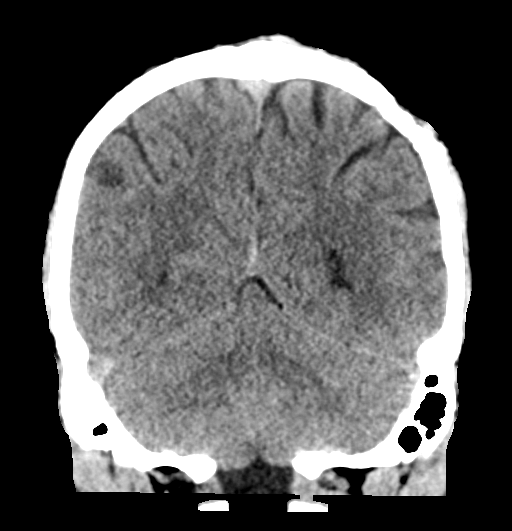
[im 34/76  brain]
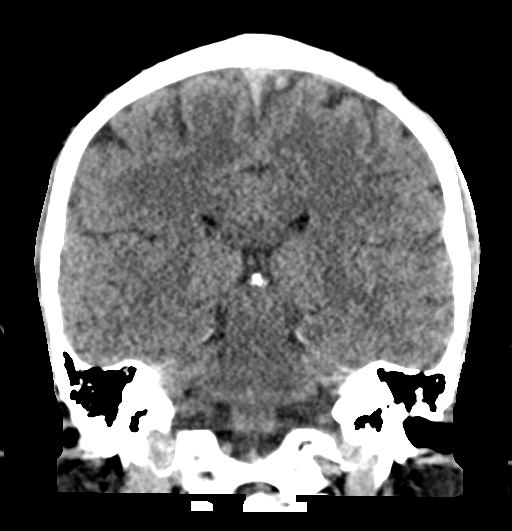
[im 42/76  brain]
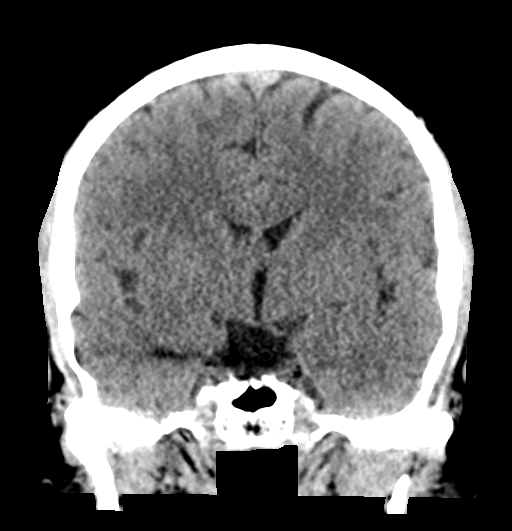

[Series 6: head without sag · sagittal · non-contrast · 0.34mm/px · 3 of 67 slices shown]
[im 23/67  brain]
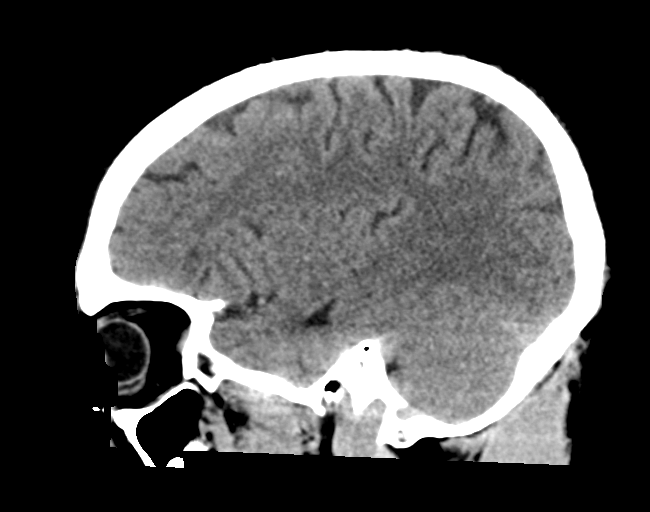
[im 34/67  brain]
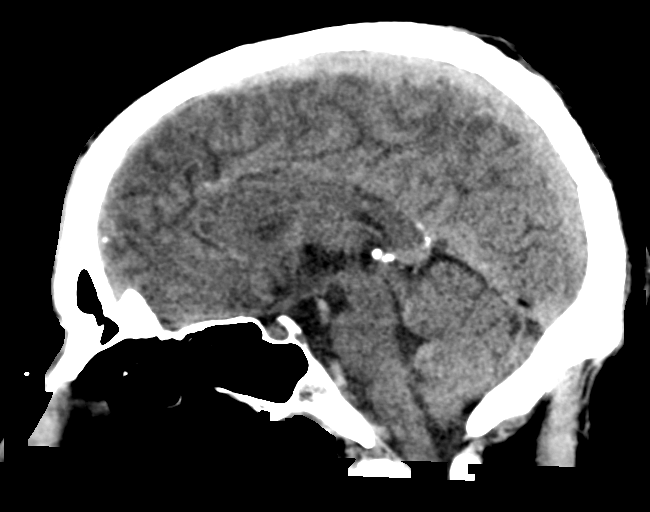
[im 45/67  brain]
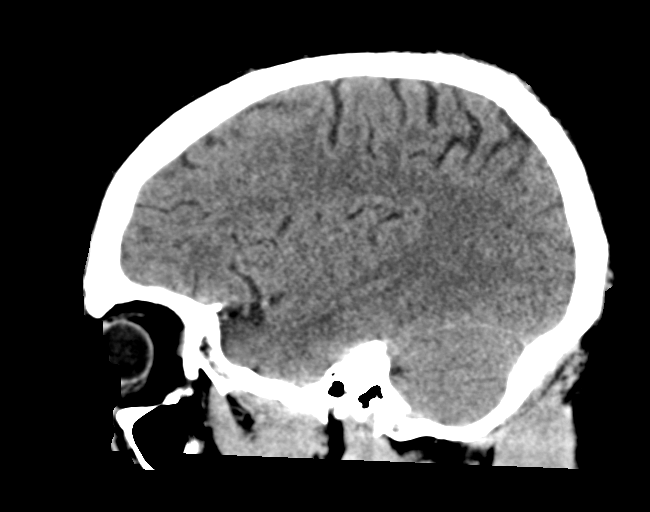

[16 of 47 positions shown; findings below may reference images not displayed]

FINDINGS: CT HEAD FINDINGS

Brain: No evidence of acute infarction, hemorrhage, hydrocephalus,
extra-axial collection or mass lesion/mass effect.

Vascular: No hyperdense vessel or unexpected calcification.

Skull: Changes of prior right craniotomy are noted in the left
posterior parietal region.

Other: None.

CT MAXILLOFACIAL FINDINGS

Osseous: Irregularity of the nasal bones is seen without significant
soft tissue abnormality. These likely represent chronic nasal bone
fractures. No acute fracture is identified.

Orbits: Orbits and their contents are within normal limits.

Sinuses: Paranasal sinuses are unremarkable.

Soft tissues: No significant soft tissue abnormality is noted.

CT CERVICAL SPINE FINDINGS

Alignment: Within normal limits.

Skull base and vertebrae: 7 cervical segments are well visualized.
Vertebral body height is well maintained. No acute fracture or acute
facet abnormality is noted.

Noted.

Soft tissues and spinal canal: Surrounding soft tissue structures
are within normal limits.

Upper chest: Visualized lung apices are unremarkable.

Other: None
IMPRESSION: CT of the head: Postsurgical changes in the posterior left parietal
region.

No acute intracranial abnormality noted.

CT of the maxillofacial bones: Mild irregularity of the nasal bones
is seen which appears chronic in nature without evidence of soft
tissue abnormality. No other fracture is seen.

CT of the cervical spine: No acute abnormality
